# Patient Record
Sex: Female | Born: 1970 | Race: White | Hispanic: No | Marital: Married | Smoking: Never smoker
Health system: Southern US, Community
[De-identification: ages and names within clinical notes are randomized; demographics above are authoritative.]

## PROBLEM LIST (undated history)

## (undated) ENCOUNTER — Inpatient Hospital Stay (HOSPITAL_COMMUNITY): Payer: Self-pay

## (undated) DIAGNOSIS — F419 Anxiety disorder, unspecified: Secondary | ICD-10-CM

## (undated) DIAGNOSIS — Z923 Personal history of irradiation: Secondary | ICD-10-CM

## (undated) DIAGNOSIS — Z789 Other specified health status: Secondary | ICD-10-CM

## (undated) DIAGNOSIS — R030 Elevated blood-pressure reading, without diagnosis of hypertension: Secondary | ICD-10-CM

## (undated) DIAGNOSIS — K219 Gastro-esophageal reflux disease without esophagitis: Secondary | ICD-10-CM

## (undated) DIAGNOSIS — R112 Nausea with vomiting, unspecified: Secondary | ICD-10-CM

## (undated) DIAGNOSIS — C50919 Malignant neoplasm of unspecified site of unspecified female breast: Secondary | ICD-10-CM

## (undated) DIAGNOSIS — Z9889 Other specified postprocedural states: Secondary | ICD-10-CM

## (undated) HISTORY — DX: Personal history of irradiation: Z92.3

## (undated) HISTORY — DX: Gastro-esophageal reflux disease without esophagitis: K21.9

## (undated) HISTORY — DX: Other specified postprocedural states: Z98.890

## (undated) HISTORY — PX: BREAST LUMPECTOMY: SHX2

## (undated) HISTORY — PX: NO PAST SURGERIES: SHX2092

## (undated) HISTORY — DX: Elevated blood-pressure reading, without diagnosis of hypertension: R03.0

## (undated) HISTORY — PX: BREAST SURGERY: SHX581

## (undated) HISTORY — PX: EXCISION OF BREAST BIOPSY: SHX5822

## (undated) HISTORY — DX: Nausea with vomiting, unspecified: R11.2

---

## 2010-01-11 ENCOUNTER — Emergency Department (HOSPITAL_COMMUNITY): Admission: EM | Admit: 2010-01-11 | Discharge: 2010-01-11 | Payer: Self-pay | Admitting: Family Medicine

## 2013-11-29 ENCOUNTER — Emergency Department (HOSPITAL_COMMUNITY)
Admission: EM | Admit: 2013-11-29 | Discharge: 2013-11-29 | Disposition: A | Discharge status: Home / Self Care | Payer: Self-pay | Attending: Emergency Medicine | Admitting: Emergency Medicine

## 2013-11-29 ENCOUNTER — Encounter (HOSPITAL_COMMUNITY): Payer: Self-pay | Admitting: Emergency Medicine

## 2013-11-29 DIAGNOSIS — N949 Unspecified condition associated with female genital organs and menstrual cycle: Secondary | ICD-10-CM | POA: Insufficient documentation

## 2013-11-29 DIAGNOSIS — R110 Nausea: Secondary | ICD-10-CM | POA: Insufficient documentation

## 2013-11-29 DIAGNOSIS — R102 Pelvic and perineal pain: Secondary | ICD-10-CM

## 2013-11-29 DIAGNOSIS — N898 Other specified noninflammatory disorders of vagina: Secondary | ICD-10-CM | POA: Insufficient documentation

## 2013-11-29 DIAGNOSIS — Z91040 Latex allergy status: Secondary | ICD-10-CM | POA: Insufficient documentation

## 2013-11-29 LAB — CBC WITH DIFFERENTIAL/PLATELET
Basophils Absolute: 0.1 10*3/uL (ref 0.0–0.1)
Basophils Relative: 1 % (ref 0–1)
Eosinophils Absolute: 0.1 10*3/uL (ref 0.0–0.7)
Eosinophils Relative: 1 % (ref 0–5)
HCT: 41.1 % (ref 36.0–46.0)
Hemoglobin: 14.3 g/dL (ref 12.0–15.0)
Lymphocytes Relative: 14 % (ref 12–46)
Lymphs Abs: 1.5 10*3/uL (ref 0.7–4.0)
MCH: 33.5 pg (ref 26.0–34.0)
MCHC: 34.8 g/dL (ref 30.0–36.0)
MCV: 96.3 fL (ref 78.0–100.0)
Monocytes Absolute: 0.6 10*3/uL (ref 0.1–1.0)
Monocytes Relative: 6 % (ref 3–12)
Neutro Abs: 8.3 10*3/uL — ABNORMAL HIGH (ref 1.7–7.7)
Neutrophils Relative %: 79 % — ABNORMAL HIGH (ref 43–77)
Platelets: 235 10*3/uL (ref 150–400)
RBC: 4.27 MIL/uL (ref 3.87–5.11)
RDW: 12.3 % (ref 11.5–15.5)
WBC: 10.6 10*3/uL — ABNORMAL HIGH (ref 4.0–10.5)

## 2013-11-29 LAB — COMPREHENSIVE METABOLIC PANEL
ALT: 13 U/L (ref 0–35)
AST: 20 U/L (ref 0–37)
Albumin: 5 g/dL (ref 3.5–5.2)
Alkaline Phosphatase: 43 U/L (ref 39–117)
BUN: 16 mg/dL (ref 6–23)
CO2: 22 mEq/L (ref 19–32)
Calcium: 9.6 mg/dL (ref 8.4–10.5)
Chloride: 100 mEq/L (ref 96–112)
Creatinine, Ser: 0.78 mg/dL (ref 0.50–1.10)
GFR calc Af Amer: >90 mL/min (ref >90)
GFR calc non Af Amer: >90 mL/min (ref >90)
Glucose, Bld: 111 mg/dL — ABNORMAL HIGH (ref 70–99)
Potassium: 4.5 mEq/L (ref 3.5–5.1)
Sodium: 138 mEq/L (ref 135–145)
Total Bilirubin: 1.2 mg/dL (ref 0.3–1.2)
Total Protein: 8.1 g/dL (ref 6.0–8.3)

## 2013-11-29 LAB — URINALYSIS, ROUTINE W REFLEX MICROSCOPIC
Bilirubin Urine: NEGATIVE
Glucose, UA: NEGATIVE mg/dL
Hgb urine dipstick: NEGATIVE
Ketones, ur: >80 mg/dL — AB
Leukocytes, UA: NEGATIVE
Nitrite: NEGATIVE
Protein, ur: NEGATIVE mg/dL
Specific Gravity, Urine: 1.023 (ref 1.005–1.030)
Urobilinogen, UA: 0.2 mg/dL (ref 0.0–1.0)
pH: 5.5 (ref 5.0–8.0)

## 2013-11-29 LAB — WET PREP, GENITAL
Clue Cells Wet Prep HPF POC: NONE SEEN
Trich, Wet Prep: NONE SEEN
Yeast Wet Prep HPF POC: NONE SEEN

## 2013-11-29 MED ORDER — ONDANSETRON HCL 4 MG/2ML IJ SOLN
4.0000 mg | Freq: Once | INTRAMUSCULAR | Status: DC
  Filled 2013-11-29: qty 2

## 2013-11-29 MED ORDER — SODIUM CHLORIDE 0.9 % IV BOLUS (SEPSIS): 1000.0000 mL | Freq: Once | INTRAVENOUS | Status: DC

## 2013-11-29 MED ORDER — MORPHINE SULFATE 4 MG/ML IJ SOLN
4.0000 mg | Freq: Once | INTRAMUSCULAR | Status: DC
  Filled 2013-11-29: qty 1

## 2013-11-29 NOTE — ED Notes (Signed)
Went to start IV and medicate patient, pt refused IV and pain medication. Pt states "I just want the pelvic exam and to go home. My pain is manageable." Fayrene Helper, PA notified. Pelvic cart set up and at bedside.

## 2013-11-29 NOTE — ED Provider Notes (Signed)
CSN: [id number]     Arrival date & time 11/29/13  1751 History   First MD Initiated Contact with Patient 11/29/13 2017     Chief Complaint  Patient presents with  . Abdominal Pain   (Consider location/radiation/quality/duration/timing/severity/associated sxs/prior Treatment) HPI  42 year old female with no significant past medical history presents complaining of abdominal pain. Patient reports 2 weeks ago which has a mild left lower quadrant abdominal tenderness lasting for less than an hour and resolved without any specific treatment. Today while sitting she developed an acute onset of achy pain to the left low pelvic with occasional sharp shooting pain radiates to the back. Pain initially was sudden onset, 9/10, and now improved to 3/10 with taking 600 mg of ibuprofen earlier. She reported mild nausea only. No complaints of fever, chills, productive cough, chest pain, shortness of breath, loss of appetite, dysuria, hematuria, hematochezia, melena, vaginal discharge, or rash. She denies any recent trauma. Patient believes she is about to start her menstrual period.   History reviewed. No pertinent past medical history. History reviewed. No pertinent past surgical history. History reviewed. No pertinent family history. History  Substance Use Topics  . Smoking status: Never Smoker   . Smokeless tobacco: Not on file  . Alcohol Use: Yes   OB History   Grav Para Term Preterm Abortions TAB SAB Ect Mult Living                 Review of Systems  All other systems reviewed and are negative.    Allergies  Latex  Home Medications   Current Outpatient Rx  Name  Route  Sig  Dispense  Refill  . ibuprofen (ADVIL,MOTRIN) 200 MG tablet   Oral   Take 400-600 mg by mouth every 4 (four) hours as needed (pain).          BP 129/70  Pulse 71  Temp(Src) 98.2 F (36.8 C) (Oral)  Resp 16  SpO2 99% Physical Exam  Nursing note and vitals reviewed. Constitutional: She appears well-developed  and well-nourished. No distress.  HENT:  Head: Normocephalic and atraumatic.  Eyes: Conjunctivae are normal.  Neck: Normal range of motion. Neck supple.  Cardiovascular: Normal rate and regular rhythm.   Pulmonary/Chest: Effort normal and breath sounds normal. She exhibits no tenderness.  Abdominal: Soft. There is no tenderness.  Genitourinary: Uterus normal. There is no rash or lesion on the right labia. There is no rash or lesion on the left labia. Cervix exhibits no motion tenderness and no discharge. Right adnexum displays no mass and no tenderness. Left adnexum displays no mass and no tenderness. No erythema, tenderness or bleeding around the vagina. Vaginal discharge found.  Chaperone present  No cva tenderness.  Lymphadenopathy:       Right: No inguinal adenopathy present.       Left: No inguinal adenopathy present.    ED Course  Procedures (including critical care time)  10:33 PM Pt with pain to LLQ.  No significant discomfort with pelvic exam but pt does have copious vaginal discharge.  I offer pain medication, pt declined.  At this time, i do not think pt have finding suggestive of ovarian torsion, no significant pelvic tenderness to suggest TOA.  Low suspicion for appy.  No bowel discomfort suggestive of colitis.  11:06 PM Wet prep with WBC TNTC.  I did offer pt for prophylactic treatment for GC/Ch, she prefers to wait for culture result instead  Pt currently denies any significant pain, does not want any  pain medication.  i recommend NSAIDs at home as needed.  Return precaution discussed.    Labs Review Labs Reviewed  WET PREP, GENITAL - Abnormal; Notable for the following:    WBC, Wet Prep HPF POC TOO NUMEROUS TO COUNT (*)    All other components within normal limits  CBC WITH DIFFERENTIAL - Abnormal; Notable for the following:    WBC 10.6 (*)    Neutrophils Relative % 79 (*)    Neutro Abs 8.3 (*)    All other components within normal limits  COMPREHENSIVE METABOLIC  PANEL - Abnormal; Notable for the following:    Glucose, Bld 111 (*)    All other components within normal limits  URINALYSIS, ROUTINE W REFLEX MICROSCOPIC - Abnormal; Notable for the following:    Ketones, ur >80 (*)    All other components within normal limits  GC/CHLAMYDIA PROBE AMP   Imaging Review No results found.  EKG Interpretation   None       MDM   1. Pelvic pain in female    BP 129/70  Pulse 71  Temp(Src) 98.2 F (36.8 C) (Oral)  Resp 16  SpO2 99%      Fayrene Helper, PA-C 11/29/13 2310

## 2013-11-29 NOTE — ED Notes (Addendum)
Pt c/o severe pelvic pain onset this afternoon, radiating into her back. Denies any vag discharge or bleeding, she is getting ready to start her cycle. Reports pelvic pain with urination and bowel movements for past few days also. Took ibuprofen with no relief

## 2013-11-29 NOTE — ED Notes (Signed)
Pt c/o lower abd pain, worse on left.  Pt denies any nausea or vomiting.

## 2013-11-29 NOTE — ED Notes (Addendum)
Cindy [name] tran at bedside performing pelvic exam. This RN as chaperone.

## 2013-11-30 LAB — GC/CHLAMYDIA PROBE AMP: CT Probe RNA: NEGATIVE

## 2013-12-03 NOTE — ED Provider Notes (Signed)
Medical screening examination/treatment/procedure(s) were performed by non-physician practitioner and as supervising physician I was immediately available for consultation/collaboration.  EKG Interpretation   None        Girolamo Lortie, MD 12/03/13 0909 

## 2013-12-26 ENCOUNTER — Inpatient Hospital Stay (HOSPITAL_COMMUNITY): Payer: Self-pay

## 2013-12-26 ENCOUNTER — Inpatient Hospital Stay (HOSPITAL_COMMUNITY)
Admission: AD | Admit: 2013-12-26 | Discharge: 2013-12-26 | Disposition: A | Discharge status: Home / Self Care | Payer: Self-pay | Source: Ambulatory Visit | Attending: Obstetrics & Gynecology | Admitting: Obstetrics & Gynecology

## 2013-12-26 ENCOUNTER — Encounter (HOSPITAL_COMMUNITY): Payer: Self-pay | Admitting: *Deleted

## 2013-12-26 DIAGNOSIS — O4690 Antepartum hemorrhage, unspecified, unspecified trimester: Secondary | ICD-10-CM

## 2013-12-26 DIAGNOSIS — R109 Unspecified abdominal pain: Secondary | ICD-10-CM | POA: Insufficient documentation

## 2013-12-26 DIAGNOSIS — O209 Hemorrhage in early pregnancy, unspecified: Secondary | ICD-10-CM | POA: Insufficient documentation

## 2013-12-26 HISTORY — DX: Other specified health status: Z78.9

## 2013-12-26 LAB — CBC
Platelets: 217 10*3/uL (ref 150–400)
RBC: 3.8 MIL/uL — ABNORMAL LOW (ref 3.87–5.11)
RDW: 12.5 % (ref 11.5–15.5)
WBC: 9.3 10*3/uL (ref 4.0–10.5)

## 2013-12-26 LAB — URINALYSIS, ROUTINE W REFLEX MICROSCOPIC
Bilirubin Urine: NEGATIVE
Ketones, ur: NEGATIVE mg/dL
Specific Gravity, Urine: 1.01 (ref 1.005–1.030)
Urobilinogen, UA: 0.2 mg/dL (ref 0.0–1.0)
pH: 6 (ref 5.0–8.0)

## 2013-12-26 LAB — URINE MICROSCOPIC-ADD ON

## 2013-12-26 LAB — ABO/RH: ABO/RH(D): O POS

## 2013-12-26 NOTE — MAU Provider Note (Signed)
History     CSN: [id number]  Arrival date and time: 12/26/13 1529   None     Chief Complaint  Patient presents with  . Possible Pregnancy  . Vaginal Bleeding  . Abdominal Cramping   HPI pt is G2P1001 @[redacted]w[redacted]d  pt c/o brown discharge yesterday morning and then pink last and then heavy med colored bleeding Today- has been through 1 pad today with clot, like a period.  Pt has not had n/v/constipation/diarrhea.  Pt denies cough or fever.  Pt denies unusual vaginal discharge.   RN note: Garvin Fila, RN Registered Nurse Signed  MAU Note Service date: 12/26/2013 3:38 PM   Patient states she has had two positive home pregnancy tests. States she started spotting yesterday but this am had red bleeding with clots like a period and started having abdominal cramping.      No past medical history on file.  No past surgical history on file.  No family history on file.  History  Substance Use Topics  . Smoking status: Never Smoker   . Smokeless tobacco: Not on file  . Alcohol Use: Yes    Allergies:  Allergies  Allergen Reactions  . Latex Swelling and Rash    Prescriptions prior to admission  Medication Sig Dispense Refill  . ibuprofen (ADVIL,MOTRIN) 200 MG tablet Take 400-600 mg by mouth every 4 (four) hours as needed (pain).        Review of Systems  Constitutional: Negative for fever and chills.  Gastrointestinal: Negative for nausea, vomiting, abdominal pain, diarrhea and constipation.  Genitourinary: Negative for dysuria and urgency.   Physical Exam   There were no vitals taken for this visit.  Physical Exam  Nursing note and vitals reviewed. Constitutional: She is oriented to person, place, and time. She appears well-developed and well-nourished.  Anxious appearing  HENT:  Head: Normocephalic.  Eyes: Pupils are equal, round, and reactive to light.  Neck: Normal range of motion. Neck supple.  Cardiovascular: Normal rate.   Respiratory: Effort normal.  GI:  Soft. She exhibits no distension. There is no tenderness. There is no rebound and no guarding.  Genitourinary:  sm- mod dark red blood in vault; with small clot; cervix closed; uterus NSSC NT; adnexa without palpable enlargement or tenderness  Musculoskeletal: Normal range of motion.  Neurological: She is alert and oriented to person, place, and time.  Skin: Skin is warm and dry.  Psychiatric: She has a normal mood and affect.    MAU Course  Procedures Results for orders placed during the hospital encounter of 12/26/13 (from the past 24 hour(s))  URINALYSIS, ROUTINE W REFLEX MICROSCOPIC     Status: Abnormal   Collection Time    12/26/13  3:45 PM      Result Value Range   Color, Urine YELLOW  YELLOW   APPearance CLEAR  CLEAR   Specific Gravity, Urine 1.010  1.005 - 1.030   pH 6.0  5.0 - 8.0   Glucose, UA NEGATIVE  NEGATIVE mg/dL   Hgb urine dipstick LARGE (*) NEGATIVE   Bilirubin Urine NEGATIVE  NEGATIVE   Ketones, ur NEGATIVE  NEGATIVE mg/dL   Protein, ur NEGATIVE  NEGATIVE mg/dL   Urobilinogen, UA 0.2  0.0 - 1.0 mg/dL   Nitrite NEGATIVE  NEGATIVE   Leukocytes, UA NEGATIVE  NEGATIVE  URINE MICROSCOPIC-ADD ON     Status: None   Collection Time    12/26/13  3:45 PM      Result Value Range  Squamous Epithelial / LPF RARE  RARE   WBC, UA 0-2  <3 WBC/hpf   RBC / HPF 0-2  <3 RBC/hpf   Bacteria, UA RARE  RARE  POCT PREGNANCY, URINE     Status: Abnormal   Collection Time    12/26/13  3:59 PM      Result Value Range   Preg Test, Ur POSITIVE (*) NEGATIVE  CBC     Status: Abnormal   Collection Time    12/26/13  4:33 PM      Result Value Range   WBC 9.3  4.0 - 10.5 K/uL   RBC 3.80 (*) 3.87 - 5.11 MIL/uL   Hemoglobin 12.3  12.0 - 15.0 g/dL   HCT 40.9  81.1 - 91.4 %   MCV 94.7  78.0 - 100.0 fL   MCH 32.4  26.0 - 34.0 pg   MCHC 34.2  30.0 - 36.0 g/dL   RDW 78.2  95.6 - 21.3 %   Platelets 217  150 - 400 K/uL  HCG, QUANTITATIVE, PREGNANCY     Status: Abnormal   Collection Time     12/26/13  4:33 PM      Result Value Range   hCG, Beta Chain, Quant, S 977 (*) <5 mIU/mL  pt had neg GC/chlamdyia and wet prep on 11/29/2013 at [facility] ED Assessment and Plan  Bleeding in pregnancy-?IUGS no Y/S seen F/u Sat Jan 3 for repeat HCG, sooner if increase in pain or bleeding  Ramal Eckhardt 12/26/2013, 3:43 PM

## 2013-12-26 NOTE — MAU Note (Signed)
Patient states she has had two positive home pregnancy tests. States she started spotting yesterday but this am had red bleeding with clots like a period and started having abdominal cramping.

## 2013-12-28 ENCOUNTER — Encounter (HOSPITAL_COMMUNITY): Payer: Self-pay | Admitting: *Deleted

## 2013-12-28 ENCOUNTER — Inpatient Hospital Stay (HOSPITAL_COMMUNITY)
Admission: AD | Admit: 2013-12-28 | Discharge: 2013-12-28 | Disposition: A | Discharge status: Home / Self Care | Payer: BC Managed Care – PPO | Source: Ambulatory Visit | Attending: Obstetrics and Gynecology | Admitting: Obstetrics and Gynecology

## 2013-12-28 DIAGNOSIS — O039 Complete or unspecified spontaneous abortion without complication: Secondary | ICD-10-CM | POA: Insufficient documentation

## 2013-12-28 DIAGNOSIS — R109 Unspecified abdominal pain: Secondary | ICD-10-CM | POA: Insufficient documentation

## 2013-12-28 LAB — CBC
HEMATOCRIT: 38.6 % (ref 36.0–46.0)
HEMOGLOBIN: 13 g/dL (ref 12.0–15.0)
MCH: 32.3 pg (ref 26.0–34.0)
MCHC: 33.7 g/dL (ref 30.0–36.0)
MCV: 95.8 fL (ref 78.0–100.0)
Platelets: 212 10*3/uL (ref 150–400)
RBC: 4.03 MIL/uL (ref 3.87–5.11)
RDW: 12.2 % (ref 11.5–15.5)
WBC: 8.2 10*3/uL (ref 4.0–10.5)

## 2013-12-28 LAB — HCG, QUANTITATIVE, PREGNANCY: hCG, Beta Chain, Quant, S: 447 m[IU]/mL — ABNORMAL HIGH (ref <5)

## 2013-12-28 NOTE — MAU Note (Signed)
Was here recently - confirmed preg, levels were low, was to comeback for follow up. Bleeding has increased.  Pelvic pressure increased since last night, passing a lot of clots.

## 2013-12-28 NOTE — MAU Provider Note (Signed)
Attestation of Attending Supervision of Advanced Practitioner (CNM/NP): Evaluation and management procedures were performed by the Advanced Practitioner under my supervision and collaboration.  I have reviewed the Advanced Practitioner's note and chart, and I agree with the management and plan.  Reggie Bise 12/28/2013 10:39 PM

## 2013-12-28 NOTE — MAU Provider Note (Signed)
History     CSN: [id number]  Arrival date and time: 12/28/13 1344   First Provider Initiated Contact with Patient 12/28/13 1600      Chief Complaint  Patient presents with  . Threatened Miscarriage   HPI  Cindy [name] is 43 y.o. G2P1001 [redacted]w[redacted]d weeks presenting follow up BHCG.  Was seen in MAU 12/31 with vaginal bleeding and cramping. BHCG on that visit was 977 and U/S showed ? UIGS no YS.  SInce that visit, she report heavier bleeding this am with huge clots with a "different texture, from liver looking to chunky".  Had cramping yesterday and today but since heavy bleeding this am, cramping less.  States she went to bathroom here and the bleeding is light and only 1 small clot.     Past Medical History  Diagnosis Date  . Medical history non-contributory     Past Surgical History  Procedure Laterality Date  . No past surgeries      History reviewed. No pertinent family history.  History  Substance Use Topics  . Smoking status: Never Smoker   . Smokeless tobacco: Not on file  . Alcohol Use: 1.2 oz/week    2 Cans of beer per week     Comment: few times a week    Allergies:  Allergies  Allergen Reactions  . Latex Swelling and Rash    Prescriptions prior to admission  Medication Sig Dispense Refill  . acetaminophen (TYLENOL) 500 MG tablet Take 500 mg by mouth every 6 (six) hours as needed.      . Prenatal Vit-Fe Fumarate-FA (PRENATAL MULTIVITAMIN) TABS tablet Take 1 tablet by mouth daily at 12 noon.        Review of Systems  Gastrointestinal: Positive for abdominal pain (cramping).  Genitourinary:       Small amount of bleeding   Physical Exam   Blood pressure 141/78, pulse 99, temperature 99.3 F (37.4 C), temperature source Oral, resp. rate 20, height 5' 2.5" (1.588 m), weight 139 lb (63.05 kg), last menstrual period 11/04/2013.  Physical Exam  Constitutional: She is oriented to person, place, and time. She appears well-developed and well-nourished.   Genitourinary:  No indicated  Neurological: She is alert and oriented to person, place, and time.  Skin: Skin is warm and dry.  Psychiatric: She has a normal mood and affect. Her behavior is normal.   Results for orders placed during the hospital encounter of 12/28/13 (from the past 24 hour(s))  CBC     Status: None   Collection Time    12/28/13  2:17 PM      Result Value Range   WBC 8.2  4.0 - 10.5 K/uL   RBC 4.03  3.87 - 5.11 MIL/uL   Hemoglobin 13.0  12.0 - 15.0 g/dL   HCT 38.6  36.0 - 46.0 %   MCV 95.8  78.0 - 100.0 fL   MCH 32.3  26.0 - 34.0 pg   MCHC 33.7  30.0 - 36.0 g/dL   RDW 12.2  11.5 - 15.5 %   Platelets 212  150 - 400 K/uL  HCG, QUANTITATIVE, PREGNANCY     Status: Abnormal   Collection Time    12/28/13  2:17 PM      Result Value Range   hCG, Beta Chain, Quant, S 447 (*) <5 mIU/mL   MAU Course  Procedures  MDM Discussed resulted BHCG and miscarriage.  Patient is sad.  She understands follow up plans.  Husband is with her  Assessment and Plan  A:  Miscarriage in the first trimester  P:  Follow up in the [facility] in 2 weeks for repeat BHCG      Instructed to return for worsening bleeding or pain     Pelvic rest until seen in clinic    Continue prenatal vitamins KEY,EVE M 12/28/2013, 4:02 PM

## 2013-12-28 NOTE — Discharge Instructions (Signed)
Miscarriage  A miscarriage is the loss of an unborn baby (fetus) before the 20th week of pregnancy. The cause is often unknown.   HOME CARE  · You may need to stay in bed (bed rest), or you may be able to do light activity. Go about activity as told by your doctor.  · Have help at home.  · Write down how many pads you use each day. Write down how soaked they are.  · Do not use tampons. Do not wash out your vagina (douche) or have sex (intercourse) until your doctor approves.  · Only take medicine as told by your doctor.  · Do not take aspirin.  · Keep all doctor visits as told.  · If you or your partner have problems with grieving, talk to your doctor. You can also try counseling. Give yourself time to grieve before trying to get pregnant again.  GET HELP RIGHT AWAY IF:  · You have bad cramps or pain in your back or belly (abdomen).  · You have a fever.  · You pass large clumps of blood (clots) from your vagina that are walnut-sized or larger. Save the clumps for your doctor to see.  · You pass large amounts of tissue from your vagina. Save the tissue for your doctor to see.  · You have more bleeding.  · You have thick, bad-smelling fluid (discharge) coming from the vagina.  · You get lightheaded, weak, or you pass out (faint).  · You have chills.  MAKE SURE YOU:  · Understand these instructions.  · Will watch your condition.  · Will get help right away if you are not doing well or get worse.  Document Released: 03/06/2012 Document Reviewed: 03/06/2012  [employer]® Patient Information ©2014 [employer].

## 2013-12-30 NOTE — MAU Provider Note (Signed)
Attestation of Attending Supervision of Advanced Practitioner (CNM/NP): Evaluation and management procedures were performed by the Advanced Practitioner under my supervision and collaboration. I have reviewed the Advanced Practitioner's note and chart, and I agree with the management and plan.  Jerren Flinchbaugh H. 12:54 PM

## 2014-01-02 ENCOUNTER — Ambulatory Visit: Payer: Self-pay

## 2014-01-11 ENCOUNTER — Other Ambulatory Visit: Payer: BC Managed Care – PPO

## 2014-01-11 DIAGNOSIS — O039 Complete or unspecified spontaneous abortion without complication: Secondary | ICD-10-CM

## 2014-01-11 LAB — HCG, QUANTITATIVE, PREGNANCY: hCG, Beta Chain, Quant, S: 2.1 m[IU]/mL

## 2014-10-28 ENCOUNTER — Encounter (HOSPITAL_COMMUNITY): Payer: Self-pay | Admitting: *Deleted

## 2014-10-31 ENCOUNTER — Encounter (HOSPITAL_COMMUNITY): Payer: Self-pay | Admitting: *Deleted

## 2014-11-27 ENCOUNTER — Other Ambulatory Visit (HOSPITAL_COMMUNITY): Payer: Self-pay | Admitting: Gynecology

## 2015-01-13 ENCOUNTER — Ambulatory Visit: Payer: BC Managed Care – PPO | Admitting: Obstetrics & Gynecology

## 2015-04-15 ENCOUNTER — Other Ambulatory Visit: Payer: Self-pay | Admitting: Gynecology

## 2015-04-15 DIAGNOSIS — N644 Mastodynia: Secondary | ICD-10-CM

## 2015-04-21 ENCOUNTER — Ambulatory Visit
Admission: RE | Admit: 2015-04-21 | Discharge: 2015-04-21 | Disposition: A | Discharge status: Home / Self Care | Payer: BLUE CROSS/BLUE SHIELD | Source: Ambulatory Visit | Attending: Gynecology | Admitting: Gynecology

## 2015-04-21 DIAGNOSIS — N644 Mastodynia: Secondary | ICD-10-CM

## 2017-04-27 ENCOUNTER — Encounter: Payer: Self-pay | Admitting: Adult Health

## 2017-04-27 LAB — HM MAMMOGRAPHY

## 2017-04-27 LAB — HM PAP SMEAR: HM PAP: NEGATIVE

## 2017-10-18 ENCOUNTER — Emergency Department (HOSPITAL_COMMUNITY): Payer: Managed Care, Other (non HMO)

## 2017-10-18 ENCOUNTER — Emergency Department (HOSPITAL_COMMUNITY)
Admission: EM | Admit: 2017-10-18 | Discharge: 2017-10-18 | Disposition: A | Discharge status: Home / Self Care | Payer: Managed Care, Other (non HMO) | Attending: Emergency Medicine | Admitting: Emergency Medicine

## 2017-10-18 ENCOUNTER — Encounter (HOSPITAL_COMMUNITY): Payer: Self-pay

## 2017-10-18 DIAGNOSIS — Z91040 Latex allergy status: Secondary | ICD-10-CM | POA: Insufficient documentation

## 2017-10-18 DIAGNOSIS — Y9389 Activity, other specified: Secondary | ICD-10-CM | POA: Insufficient documentation

## 2017-10-18 DIAGNOSIS — S99921A Unspecified injury of right foot, initial encounter: Secondary | ICD-10-CM | POA: Diagnosis present

## 2017-10-18 DIAGNOSIS — Z23 Encounter for immunization: Secondary | ICD-10-CM | POA: Diagnosis not present

## 2017-10-18 DIAGNOSIS — Y999 Unspecified external cause status: Secondary | ICD-10-CM | POA: Insufficient documentation

## 2017-10-18 DIAGNOSIS — Z791 Long term (current) use of non-steroidal anti-inflammatories (NSAID): Secondary | ICD-10-CM | POA: Diagnosis not present

## 2017-10-18 DIAGNOSIS — S93601A Unspecified sprain of right foot, initial encounter: Secondary | ICD-10-CM | POA: Insufficient documentation

## 2017-10-18 DIAGNOSIS — W01198A Fall on same level from slipping, tripping and stumbling with subsequent striking against other object, initial encounter: Secondary | ICD-10-CM | POA: Diagnosis not present

## 2017-10-18 DIAGNOSIS — Y929 Unspecified place or not applicable: Secondary | ICD-10-CM | POA: Insufficient documentation

## 2017-10-18 DIAGNOSIS — Z79899 Other long term (current) drug therapy: Secondary | ICD-10-CM | POA: Insufficient documentation

## 2017-10-18 MED ORDER — TETANUS-DIPHTH-ACELL PERTUSSIS 5-2.5-18.5 LF-MCG/0.5 IM SUSP
0.5000 mL | Freq: Once | INTRAMUSCULAR | Status: AC
  Administered 2017-10-18: 0.5 mL via INTRAMUSCULAR
  Filled 2017-10-18: qty 0.5

## 2017-10-18 NOTE — ED Provider Notes (Signed)
[facility] EMERGENCY DEPARTMENT Provider Note   CSN: [id number] Arrival date & time: 10/18/17  5631     History   Chief Complaint No chief complaint on file.   HPI Cindy [name] is a 46 y.o. female.  HPI   Patient is a 46 year old female no significant past medical history presenting for 10 days of right foot pain after kicking a door.  No other injuries occurred at this event.  Patient reports that she has had aching in the right foot approximately 4 out of 10 in severity.  Patient was ambulatory immediately after the event.  Patient is attempted ibuprofen, icing, and elevation of foot.  Patient denies any weakness, numbness in the toes.  Patient reports she has had some swelling and ecchymosis, but no erythema.  No history of diabetes.  Past Medical History:  Diagnosis Date  . Medical history non-contributory     There are no active problems to display for this patient.   Past Surgical History:  Procedure Laterality Date  . NO PAST SURGERIES      OB History    Gravida Para Term Preterm AB Living   2 1 1     1    SAB TAB Ectopic Multiple Live Births                   Home Medications    Prior to Admission medications   Medication Sig Start Date End Date Taking? Authorizing Provider  ibuprofen (ADVIL,MOTRIN) 200 MG tablet Take 200 mg by mouth every 6 (six) hours as needed for mild pain.   Yes [provider]  acetaminophen (TYLENOL) 500 MG tablet Take 500 mg by mouth every 6 (six) hours as needed.    [provider]  Prenatal Vit-Fe Fumarate-FA (PRENATAL MULTIVITAMIN) TABS tablet Take 1 tablet by mouth daily at 12 noon.    [provider]    Family History No family history on file.  Social History Social History  Substance Use Topics  . Smoking status: Never Smoker  . Smokeless tobacco: Never Used  . Alcohol use 1.2 oz/week    2 Cans of beer per week     Comment: few times a week     Allergies    Latex   Review of Systems Review of Systems  Musculoskeletal: Positive for joint swelling.  Skin: Positive for color change and wound.  Neurological: Negative for weakness and numbness.     Physical Exam Updated Vital Signs BP (!) 148/91   Pulse 78   Temp 98.3 F (36.8 C) (Oral)   Resp 16   LMP 09/12/2017   SpO2 99%   Physical Exam  Constitutional: She appears well-developed and well-nourished. No distress.  Sitting comfortably in bed.  HENT:  Head: Normocephalic and atraumatic.  Eyes: Conjunctivae are normal. Right eye exhibits no discharge. Left eye exhibits no discharge.  EOMs normal to gross examination.  Neck: Normal range of motion.  Cardiovascular: Normal rate and regular rhythm.   Intact, equal, and 2+ DP and PT pulses bilaterally.  Pulmonary/Chest:  Normal respiratory effort. Patient converses comfortably. No audible wheeze or stridor.  Abdominal: She exhibits no distension.  Musculoskeletal: Normal range of motion.  Ecchymosis noted in inter-webspace between first and second digits of right lower extremity.  No erythema or edema. Full range of motion of right ankle including flexion, extension, inversion, and eversion.  No pain to palpation of calcaneus, MTPs, navicular, medial malleolus, lateral malleolus, or base of fifth metatarsal.  Neurological: She is alert.  Cranial nerves intact to gross observation. Patient moves extremities without difficulty.  Skin: Skin is warm and dry. She is not diaphoretic.  Psychiatric: She has a normal mood and affect. Her behavior is normal. Judgment and thought content normal.  Nursing note and vitals reviewed.    ED Treatments / Results  Labs (all labs ordered are listed, but only abnormal results are displayed) Labs Reviewed - No data to display  EKG  EKG Interpretation None       Radiology Dg Foot Complete Right  Result Date: 10/18/2017 CLINICAL DATA:  Right foot pain and bruising after blunt trauma 10  days ago. EXAM: RIGHT FOOT COMPLETE - 3+ VIEW COMPARISON:  None. FINDINGS: There is no evidence of fracture or dislocation. There is no evidence of arthropathy or other focal bone abnormality. Soft tissues are unremarkable. IMPRESSION: Negative. Electronically Signed   By: Lorriane Shire M.D.   On: 10/18/2017 10:00    Procedures Procedures (including critical care time)  Medications Ordered in ED Medications  Tdap (BOOSTRIX) injection 0.5 mL (0.5 mLs Intramuscular Given 10/18/17 1329)     Initial Impression / Assessment and Plan / ED Course  I have reviewed the triage vital signs and the nursing notes.  Pertinent labs & imaging results that were available during my care of the patient were reviewed by me and considered in my medical decision making (see chart for details).  Clinical Course as of Oct 18 1330  Tue Oct 18, 2017  1246 BP: Marland Kitchen 148/91 [AM]    Clinical Course User Index [AM] Albesa Seen, PA-C     Final Clinical Impressions(s) / ED Diagnoses   Final diagnoses:  Sprain of right foot, initial encounter   Examination of the foot is not concerning for Lisfranc injury, or other soft tissue concerns.  X-ray negative for fracture.  Pain is mostly isolated to the great toe.  There is ecchymosis, but minimal swelling at this time.  This is likely a sprain of the foot.  Patient is stable to follow-up with orthopedics as needed, and use a postop shoe as needed for protection and support. RICE therapy recommended to patient.  Tdap updated today due to small cut in foot. Patient and family are in agreement and understanding of the plan of care.  Elevated blood pressure noted to patient.  Patient reports that she drank coffee before coming in and often has elevations.  Patient will follow up with primary care provider about this.  New Prescriptions New Prescriptions   No medications on file     Tamala Julian 10/18/17 [place], MD 10/18/17 1712

## 2017-10-18 NOTE — ED Triage Notes (Signed)
Patient complains of right foot pain x 10 days. States pain worse with ambulation after kicking door

## 2017-10-18 NOTE — Discharge Instructions (Addendum)
Please see the information and instructions below regarding your visit.  Your diagnoses today include:  1. Sprain of right foot, initial encounter    Your provider has diagnosed you as suffering from an foot sprain. Foot sprain occurs when the ligaments that hold the foot together may be stretched or torn. It may take 4 to 6 weeks to heal.  Tests performed today include: An x-ray of your foot - does NOT show any broken bones  See side panel of your discharge paperwork for testing performed today. Vital signs are listed at the bottom of these instructions.   Medications prescribed:  Take any prescribed medications only as prescribed, and any over the counter medications only as directed on the packaging.  You are prescribed ibuprofen a non-steroidal anti-inflammatory agent (NSAID) for pain. You may take 400-600 mg every 6 hours as needed for pain. If still requiring this medication around the clock for acute pain after 10 days, please see your primary healthcare provider.  You may combine this medication with Tylenol, 650 mg every 6 hours, so you are receiving something for pain every 3 hours.  This is not a long-term medication unless under the care and direction of your primary provider. Taking this medication long-term and not under the supervision of a healthcare provider could increase the risk of stomach ulcers, kidney problems, and cardiovascular problems such as high blood pressure.    Home care instructions:  Follow R.I.C.E. Protocol: R - rest your injury  I  - use ice on injury without applying directly to skin C - compress injury with bandage or splint E - elevate the injury as much as possible  For Activity: Wear postop shoe as needed for 1-2 weeks for protection and stabilization. If prescribed crutches, use crutches with non-weight bearing for the first few days. Then, you may walk on your ankle as the pain allows, or as instructed. Start gradually with weight bearing on the  affected ankle. Once you can walk pain free, then try jogging. When you can run forwards, then you can try moving side-to-side. If you cannot walk without crutches in one week, you need a re-check.  Please follow any educational materials contained in this packet.   Follow-up instructions: Please follow-up with your primary care provider or the provided orthopedic (bone specialist) listed in this packet if you continue to have significant pain or trouble walking in 1 week. In this case you may have a severe sprain that requires further care.   Return instructions:  Please return if your toes are numb or tingling, appear gray or blue, are much colder than your other foot, or you have severe pain (also elevate leg and loosen splint or wrap). Please return to the Emergency Department if you experience worsening symptoms.  Please return if you have any other emergent concerns.  Additional Information:   Your vital signs today were: BP (!) 148/91    Pulse 78    Temp 98.3 F (36.8 C) (Oral)    Resp 16    LMP 09/12/2017    SpO2 99%  If your blood pressure (BP) was elevated on multiple readings during this visit above 130 for the top number or above 80 for the bottom number, please have this repeated by your primary care provider within one month. --------------  Thank you for allowing [place] to participate in your care today.     To find a primary care or specialty doctor please call [phone] or [phone] to access "[facility]  Find a Doctor Service."  You may also go on the [employer] at [email or web address]  There are also multiple [facility] practices throughout the [place] that are frequently accepting new patients. You may find a clinic that is close to your home and contact them.  [facility] [place] [phone]  [facility] in [place] (also locations in [place] and [place]) - [place] [phone]  [place] [phone]

## 2018-02-22 ENCOUNTER — Encounter (INDEPENDENT_AMBULATORY_CARE_PROVIDER_SITE_OTHER): Payer: Self-pay | Admitting: Orthopaedic Surgery

## 2018-02-22 ENCOUNTER — Ambulatory Visit (INDEPENDENT_AMBULATORY_CARE_PROVIDER_SITE_OTHER): Payer: Managed Care, Other (non HMO) | Admitting: Orthopaedic Surgery

## 2018-02-22 ENCOUNTER — Ambulatory Visit (INDEPENDENT_AMBULATORY_CARE_PROVIDER_SITE_OTHER): Payer: Managed Care, Other (non HMO)

## 2018-02-22 DIAGNOSIS — M25571 Pain in right ankle and joints of right foot: Secondary | ICD-10-CM | POA: Diagnosis not present

## 2018-02-22 DIAGNOSIS — R2241 Localized swelling, mass and lump, right lower limb: Secondary | ICD-10-CM

## 2018-02-22 NOTE — Progress Notes (Signed)
Office Visit Note   Patient: Cindy [name]           Date of Birth: October 16, 1971           MRN: [id number] Visit Date: 02/22/2018              Requested by: No referring provider defined for this encounter. PCP: Patient, No Pcp Per   Assessment & Plan: Visit Diagnoses:  1. Pain in right ankle and joints of right foot   2. Foot mass, right     Plan: Given the mass on the lateral foot as well as the fact that her pain is persisting now for 4 months an MRI of the right foot is warranted to assess the mass as well as assess the MTP joint for stress fracture and to look at the cartilage given the severity of her continued pain.  She is trialed all forms of conservative treatment and failed rest, time, ice, activity modification, postoperative shoe and anti-inflammatories.  I am concerned about a mass on the plantar aspect of her foot as well.  We will see her back once the MRI is obtained.  Follow-Up Instructions: Return in about 2 weeks (around 03/08/2018).   Orders:  Orders Placed This Encounter  Procedures  . XR Foot Complete Right   No orders of the defined types were placed in this encounter.     Procedures: No procedures performed   Clinical Data: No additional findings.   Subjective: Chief Complaint  Patient presents with  . Right Ankle - Pain  Patient is a very pleasant 47 year old who denies any history of significant foot pain until she injured her right foot accidentally kicking a door in October 2018.  Is now been 4 months and she has a constant aching pain she points all around the first ray in the first MTP joint of her right foot.  She has had x-rays in October which were negative for any fracture.  This was all done to the emergency room.  She denies any specific problems with her feet before then.  She was given a postoperative shoe and try to transition back to regular shoes but is grabbing her foot too much and causing still pain.  She denies being a diabetic.   She denies a history of gout.  She says she will are still uncomfortable and is becoming constant ache.  She is tried activity modification and anti-inflammatories.  She also has noted a mass that is come up in the soft tissue on the plantar aspect of her foot and this is near the MTP joint where she points to.  She also had a laceration along the medial border.  HPI  Review of Systems She currently denies any headache, chest pain, shortness of breath, fever, chills, nausea, vomiting.  Objective: Vital Signs: There were no vitals taken for this visit.  Physical Exam She is alert and oriented x3 and in no acute distress Ortho Exam Examination of her right foot shows a palpable mass along the first ray on the plantar aspect of her foot right along the plantar fascia, suggestive of a plantar fibromatosis but is definitely a mass in the soft tissue.  She has significant pain at the first MTP joint and stressing the joint with no open wounds.  Hyperflexion causes severe pain.  There is slight bunion malalignment and this is transferring pain to the second ray. Specialty Comments:  No specialty comments available.  Imaging: Xr Foot Complete Right  Result  Date: 02/22/2018 3 views of the right foot show slight malalignment of the first MTP joint suggesting a mild bunion deformity.  Otherwise there is no acute findings on plain film.    PMFS History: Patient Active Problem List   Diagnosis Date Noted  . Foot mass, right 02/22/2018  . Pain in right ankle and joints of right foot 02/22/2018   Past Medical History:  Diagnosis Date  . Medical history non-contributory     No family history on file.  Past Surgical History:  Procedure Laterality Date  . NO PAST SURGERIES     Social History   Occupational History  . Not on file  Tobacco Use  . Smoking status: Never Smoker  . Smokeless tobacco: Never Used  Substance and Sexual Activity  . Alcohol use: Yes    Alcohol/week: 1.2 oz     Types: 2 Cans of beer per week    Comment: few times a week  . Drug use: No  . Sexual activity: Yes

## 2018-02-23 ENCOUNTER — Telehealth (INDEPENDENT_AMBULATORY_CARE_PROVIDER_SITE_OTHER): Payer: Self-pay | Admitting: Orthopaedic Surgery

## 2018-02-23 NOTE — Telephone Encounter (Signed)
I am fine with her trying to put weight on her foot.  I know we are waiting on an MRI to assess her foot for a stress fracture as well as to assess the mass on the plantar aspect of her foot.  There is really no brace to recommend.

## 2018-02-23 NOTE — Telephone Encounter (Signed)
See below

## 2018-02-23 NOTE — Telephone Encounter (Signed)
Patient called having a few questions about her ankle. Wanted to know if she could apply any weight to it, should she wear a brace, etc. CB # [phone]

## 2018-02-24 ENCOUNTER — Other Ambulatory Visit (INDEPENDENT_AMBULATORY_CARE_PROVIDER_SITE_OTHER): Payer: Self-pay

## 2018-02-24 DIAGNOSIS — M25571 Pain in right ankle and joints of right foot: Secondary | ICD-10-CM

## 2018-02-24 NOTE — Telephone Encounter (Signed)
Patient aware of below message

## 2018-03-08 ENCOUNTER — Ambulatory Visit (INDEPENDENT_AMBULATORY_CARE_PROVIDER_SITE_OTHER): Payer: Managed Care, Other (non HMO) | Admitting: Orthopaedic Surgery

## 2018-03-10 ENCOUNTER — Ambulatory Visit
Admission: RE | Admit: 2018-03-10 | Discharge: 2018-03-10 | Disposition: A | Discharge status: Home / Self Care | Payer: Managed Care, Other (non HMO) | Source: Ambulatory Visit | Attending: Orthopaedic Surgery | Admitting: Orthopaedic Surgery

## 2018-03-10 DIAGNOSIS — M25571 Pain in right ankle and joints of right foot: Secondary | ICD-10-CM

## 2018-03-15 NOTE — Progress Notes (Signed)
Subjective:    Patient ID: Cindy [name], female    DOB: 1971/11/05, 47 y.o.   MRN: [id number]  HPI:  Cindy [name] is here to establish as a new pt.  She is a pleasant 47 year old female.  PMH: R ankle/foot pain r/t to injury Oct 2018.  She recently had MRI and will f/u with Ortho/Dr. Ninfa Linden next week. She also reports anxiety and acute increase in sx's r/t to Cindy [name] passing away a few years ago at age 38 from complications from diabetes. She has never been on an antidepressant but has used Valium in the past with good sx control and denied SE. She has been in therapy on/off throughout Cindy life and is agreeable to referral to mental health. 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 She tries to practice yoga several times/week but has recently been limited r/t R foot pain    Patient Care Team    Relationship Specialty Notifications Start End  [place] Grandchild, NP PCP - General Family Medicine  03/16/18   [facility]    03/16/18   [place]ur Rossetti, [place] Physician Orthopedic Surgery  03/16/18     Patient Active Problem List   Diagnosis Date Noted  . Foot mass, right 02/22/2018  . Pain in right ankle and joints of right foot 02/22/2018     Past Medical History:  Diagnosis Date  . Medical history non-contributory      Past Surgical History:  Procedure Laterality Date  . NO PAST SURGERIES       Family History  Problem Relation Age of Onset  . Cancer [name]        brain  . Diabetes [name]   . Cancer Maternal [name]        lung  . Cancer Maternal [name]        brain and bone  . Cancer Paternal [name]        brain     Social History   Substance and Sexual Activity  Drug Use No     Social History   Substance and Sexual Activity  Alcohol Use Yes  . Alcohol/week: 3.6 oz  . Types:  6 Cans of beer per week   Comment: few times a week     Social History   Tobacco Use  Smoking Status Never Smoker  Smokeless Tobacco Never Used     Outpatient Encounter Medications as of 03/16/2018  Medication Sig  . diazepam (VALIUM) 5 MG tablet Take 1 tablet (5 mg total) by mouth every 12 (twelve) hours as needed for anxiety.  . [DISCONTINUED] acetaminophen (TYLENOL) 500 MG tablet Take 500 mg by mouth every 6 (six) hours as needed.  . [DISCONTINUED] ibuprofen (ADVIL,MOTRIN) 200 MG tablet Take 200 mg by mouth every 6 (six) hours as needed for mild pain.  . [DISCONTINUED] Prenatal Vit-Fe Fumarate-FA (PRENATAL MULTIVITAMIN) TABS tablet Take 1 tablet by mouth daily at 12 noon.   No facility-administered encounter medications on file as of 03/16/2018.     Allergies: Latex  Body mass index is 25.5 kg/m.  Blood pressure (!) 157/92, pulse 92, height 5' 2.75" (1.594 m), weight 142 lb 12.8 oz (64.8 kg), last menstrual period 02/22/2018, SpO2 99 %, unknown if currently breastfeeding.   Review of Systems  Constitutional: Positive for fatigue. Negative for activity change, appetite change, chills, diaphoresis, fever and unexpected  weight change.  Eyes: Negative for visual disturbance.  Respiratory: Negative for cough, chest tightness, shortness of breath, wheezing and stridor.   Cardiovascular: Negative for chest pain, palpitations and leg swelling.  Gastrointestinal: Negative for abdominal distention, abdominal pain, blood in stool, constipation, diarrhea, nausea and vomiting.  Endocrine: Negative for cold intolerance, heat intolerance, polydipsia, polyphagia and polyuria.  Genitourinary: Negative for difficulty urinating and flank pain.  Neurological: Negative for dizziness and headaches.  Hematological: Does not bruise/bleed easily.  Psychiatric/Behavioral: Positive for dysphoric mood and sleep disturbance. Negative for confusion, decreased concentration, hallucinations, self-injury  and suicidal ideas. The patient is nervous/anxious. The patient is not hyperactive.        Objective:   Physical Exam  Constitutional: She is oriented to person, place, and time. She appears well-developed and well-nourished.  HENT:  Head: Normocephalic and atraumatic.  Right Ear: External ear normal.  Left Ear: External ear normal.  Eyes: Pupils are equal, round, and reactive to light. Conjunctivae are normal.  Cardiovascular: Normal rate, regular rhythm, normal heart sounds and intact distal pulses.  Pulmonary/Chest: Effort normal and breath sounds normal. No respiratory distress. She has no wheezes. She has no rales. She exhibits no tenderness.  Neurological: She is alert and oriented to person, place, and time.  Skin: Skin is warm and dry. No rash noted. She is not diaphoretic. No erythema. No pallor.  Flushed face  Psychiatric: Cindy behavior is normal. Judgment and thought content normal. Cindy mood appears anxious. Cindy speech is rapid and/or pressured. Cognition and memory are normal.  Nursing note and vitals reviewed.         Assessment & Plan:   1. Anxiety   2. Healthcare maintenance     [place] Controlled Substance Database reviewed- no contraindications noted Diazepam 5mg  BID PRN, 30 ct provided Referral to mental health placed Continue to practice as much yoga as tolerated (R foot pain)  Healthcare maintenance Continue excellent water intake and follow heart healthy diet. Practice yoga as much as can tolerate with right foot pain. Referral to mental health placed. Please use Valium as needed- do not drink alcohol or drive while taking medication. Please schedule fasting lab appt in few weeks, then schedule complete physical a week or so after.    FOLLOW-UP:  Return in about 1 month (around 04/13/2018) for CPE, Fasting Labs.

## 2018-03-16 ENCOUNTER — Encounter: Payer: Self-pay | Admitting: Adult Health

## 2018-03-16 ENCOUNTER — Ambulatory Visit (INDEPENDENT_AMBULATORY_CARE_PROVIDER_SITE_OTHER): Payer: Managed Care, Other (non HMO) | Admitting: Adult Health

## 2018-03-16 VITALS — BP 157/92 | HR 92 | Ht 62.75 in | Wt 142.8 lb

## 2018-03-16 DIAGNOSIS — F419 Anxiety disorder, unspecified: Secondary | ICD-10-CM | POA: Diagnosis not present

## 2018-03-16 DIAGNOSIS — Z0000 Encounter for general adult medical examination without abnormal findings: Secondary | ICD-10-CM | POA: Insufficient documentation

## 2018-03-16 MED ORDER — DIAZEPAM 5 MG PO TABS: 5.0000 mg | ORAL_TABLET | Freq: Two times a day (BID) | ORAL | 0 refills | Status: DC | PRN

## 2018-03-16 NOTE — Assessment & Plan Note (Signed)
Continue excellent water intake and follow heart healthy diet. Practice yoga as much as can tolerate with right foot pain. Referral to mental health placed. Please use Valium as needed- do not drink alcohol or drive while taking medication. Please schedule fasting lab appt in few weeks, then schedule complete physical a week or so after.

## 2018-03-16 NOTE — Patient Instructions (Signed)
Heart-Healthy Eating Plan Heart-healthy meal planning includes:  Limiting unhealthy fats.  Increasing healthy fats.  Making other small dietary changes.  You may need to talk with your doctor or a diet specialist (dietitian) to create an eating plan that is right for you. What types of fat should I choose?  Choose healthy fats. These include olive oil and canola oil, flaxseeds, walnuts, almonds, and seeds.  Eat more omega-3 fats. These include salmon, mackerel, sardines, tuna, flaxseed oil, and ground flaxseeds. Try to eat fish at least twice each week.  Limit saturated fats. ? Saturated fats are often found in animal products, such as meats, butter, and cream. ? Plant sources of saturated fats include palm oil, palm kernel oil, and coconut oil.  Avoid foods with partially hydrogenated oils in them. These include stick margarine, some tub margarines, cookies, crackers, and other baked goods. These contain trans fats. What general guidelines do I need to follow?  Check food labels carefully. Identify foods with trans fats or high amounts of saturated fat.  Fill one half of your plate with vegetables and green salads. Eat 4-5 servings of vegetables per day. A serving of vegetables is: ? 1 cup of raw leafy vegetables. ?  cup of raw or cooked cut-up vegetables. ?  cup of vegetable juice.  Fill one fourth of your plate with whole grains. Look for the word "whole" as the first word in the ingredient list.  Fill one fourth of your plate with lean protein foods.  Eat 4-5 servings of fruit per day. A serving of fruit is: ? One medium whole fruit. ?  cup of dried fruit. ?  cup of fresh, frozen, or canned fruit. ?  cup of 100% fruit juice.  Eat more foods that contain soluble fiber. These include apples, broccoli, carrots, beans, peas, and barley. Try to get 20-30 g of fiber per day.  Eat more home-cooked food. Eat less restaurant, buffet, and fast food.  Limit or avoid  alcohol.  Limit foods high in starch and sugar.  Avoid fried foods.  Avoid frying your food. Try baking, boiling, grilling, or broiling it instead. You can also reduce fat by: ? Removing the skin from poultry. ? Removing all visible fats from meats. ? Skimming the fat off of stews, soups, and gravies before serving them. ? Steaming vegetables in water or broth.  Lose weight if you are overweight.  Eat 4-5 servings of nuts, legumes, and seeds per week: ? One serving of dried beans or legumes equals  cup after being cooked. ? One serving of nuts equals 1 ounces. ? One serving of seeds equals  ounce or one tablespoon.  You may need to keep track of how much salt or sodium you eat. This is especially true if you have high blood pressure. Talk with your doctor or dietitian to get more information. What foods can I eat? Grains Breads, including French, white, pita, wheat, raisin, rye, oatmeal, and Italian. Tortillas that are neither fried nor made with lard or trans fat. Low-fat rolls, including hotdog and hamburger buns and English muffins. Biscuits. Muffins. Waffles. Pancakes. Light popcorn. Whole-grain cereals. Flatbread. Melba toast. Pretzels. Breadsticks. Rusks. Low-fat snacks. Low-fat crackers, including oyster, saltine, matzo, graham, animal, and rye. Rice and pasta, including brown rice and pastas that are made with whole wheat. Vegetables All vegetables. Fruits All fruits, but limit coconut. Meats and Other Protein Sources Lean, well-trimmed beef, veal, pork, and lamb. Chicken and turkey without skin. All fish and shellfish.   Wild duck, rabbit, pheasant, and venison. Egg whites or low-cholesterol egg substitutes. Dried beans, peas, lentils, and tofu. Seeds and most nuts. Dairy Low-fat or nonfat cheeses, including ricotta, string, and mozzarella. Skim or 1% milk that is liquid, powdered, or evaporated. Buttermilk that is made with low-fat milk. Nonfat or low-fat  yogurt. Beverages Mineral water. Diet carbonated beverages. Sweets and Desserts Sherbets and fruit ices. Honey, jam, marmalade, jelly, and syrups. Meringues and gelatins. Pure sugar candy, such as hard candy, jelly beans, gumdrops, mints, marshmallows, and small amounts of dark chocolate. [employer] Eat all sweets and desserts in moderation. Fats and Oils Nonhydrogenated (trans-free) margarines. Vegetable oils, including soybean, sesame, sunflower, olive, peanut, safflower, corn, canola, and cottonseed. Salad dressings or mayonnaise made with a vegetable oil. Limit added fats and oils that you use for cooking, baking, salads, and as spreads. Other Cocoa powder. Coffee and tea. All seasonings and condiments. The items listed above may not be a complete list of recommended foods or beverages. Contact your dietitian for more options. What foods are not recommended? Grains Breads that are made with saturated or trans fats, oils, or whole milk. Croissants. Butter rolls. Cheese breads. Sweet rolls. Donuts. Buttered popcorn. Chow mein noodles. High-fat crackers, such as cheese or butter crackers. Meats and Other Protein Sources Fatty meats, such as hotdogs, short ribs, sausage, spareribs, bacon, rib eye roast or steak, and mutton. High-fat deli meats, such as salami and bologna. Caviar. Domestic duck and goose. Organ meats, such as kidney, liver, sweetbreads, and heart. Dairy Cream, sour cream, cream cheese, and creamed cottage cheese. Whole-milk cheeses, including blue (bleu), Monte[place] Jack, [place], [place], American, [place], Swiss, cheddar, [place], and [place]. Whole or 2% milk that is liquid, evaporated, or condensed. Whole buttermilk. Cream sauce or high-fat cheese sauce. Yogurt that is made from whole milk. Beverages Regular sodas and juice drinks with added sugar. Sweets and Desserts Frosting. Pudding. Cookies. Cakes other than angel food cake. Candy that has milk chocolate or white  chocolate, hydrogenated fat, butter, coconut, or unknown ingredients. Buttered syrups. Full-fat ice cream or ice cream drinks. Fats and Oils Gravy that has suet, meat fat, or shortening. Cocoa butter, hydrogenated oils, palm oil, coconut oil, palm kernel oil. These can often be found in baked products, candy, fried foods, nondairy creamers, and whipped toppings. Solid fats and shortenings, including bacon fat, salt pork, lard, and butter. Nondairy cream substitutes, such as coffee creamers and sour cream substitutes. Salad dressings that are made of unknown oils, cheese, or sour cream. The items listed above may not be a complete list of foods and beverages to avoid. Contact your dietitian for more information. This information is not intended to replace advice given to you by your health care provider. Make sure you discuss any questions you have with your health care provider. Document Released: 06/13/2012 Document Revised: 05/20/2016 Document Reviewed: 06/06/2014 Elsevier Interactive Patient Education  2018 [place] After being diagnosed with an anxiety disorder, you may be relieved to know why you have felt or behaved a certain way. It is natural to also feel overwhelmed about the treatment ahead and what it will mean for your life. With care and support, you can manage this condition and recover from it. How to cope with anxiety Dealing with stress Stress is your body's reaction to life changes and events, both good and bad. Stress can last just a few hours or it can be ongoing. Stress can play a major role in  anxiety, so it is important to learn both how to cope with stress and how to think about it differently. Talk with your health care provider or a counselor to learn more about stress reduction. He or she may suggest some stress reduction techniques, such as:  Music therapy. This can include creating or listening to music that you enjoy and that inspires  you.  Mindfulness-based meditation. This involves being aware of your normal breaths, rather than trying to control your breathing. It can be done while sitting or walking.  Centering prayer. This is a kind of meditation that involves focusing on a word, phrase, or sacred image that is meaningful to you and that brings you peace.  Deep breathing. To do this, expand your stomach and inhale slowly through your nose. Hold your breath for 3-5 seconds. Then exhale slowly, allowing your stomach muscles to relax.  Self-talk. This is a skill where you identify thought patterns that lead to anxiety reactions and correct those thoughts.  Muscle relaxation. This involves tensing muscles then relaxing them.  Choose a stress reduction technique that fits your lifestyle and personality. Stress reduction techniques take time and practice. Set aside 5-15 minutes a day to do them. Therapists can offer training in these techniques. The training may be covered by some insurance plans. Other things you can do to manage stress include:  Keeping a stress diary. This can help you learn what triggers your stress and ways to control your response.  Thinking about how you respond to certain situations. You may not be able to control everything, but you can control your reaction.  Making time for activities that help you relax, and not feeling guilty about spending your time in this way.  Therapy combined with coping and stress-reduction skills provides the best chance for successful treatment. Medicines Medicines can help ease symptoms. Medicines for anxiety include:  Anti-anxiety drugs.  Antidepressants.  Beta-blockers.  Medicines may be used as the main treatment for anxiety disorder, along with therapy, or if other treatments are not working. Medicines should be prescribed by a health care provider. Relationships Relationships can play a big part in helping you recover. Try to spend more time connecting  with trusted friends and family members. Consider going to couples counseling, taking family education classes, or going to family therapy. Therapy can help you and others better understand the condition. How to recognize changes in your condition Everyone has a different response to treatment for anxiety. Recovery from anxiety happens when symptoms decrease and stop interfering with your daily activities at home or work. This may mean that you will start to:  Have better concentration and focus.  Sleep better.  Be less irritable.  Have more energy.  Have improved memory.  It is important to recognize when your condition is getting worse. Contact your health care provider if your symptoms interfere with home or work and you do not feel like your condition is improving. Where to find help and support: You can get help and support from these sources:  Self-help groups.  Online and [employer].  A trusted spiritual leader.  Couples counseling.  Family education classes.  Family therapy.  Follow these instructions at home:  Eat a healthy diet that includes plenty of vegetables, fruits, whole grains, low-fat dairy products, and lean protein. Do not eat a lot of foods that are high in solid fats, added sugars, or salt.  Exercise. Most adults should do the following: ? Exercise for at least 150 minutes  each week. The exercise should increase your heart rate and make you sweat (moderate-intensity exercise). ? Strengthening exercises at least twice a week.  Cut down on caffeine, tobacco, alcohol, and other potentially harmful substances.  Get the right amount and quality of sleep. Most adults need 7-9 hours of sleep each night.  Make choices that simplify your life.  Take over-the-counter and prescription medicines only as told by your health care provider.  Avoid caffeine, alcohol, and certain over-the-counter cold medicines. These may make you feel worse. Ask your  pharmacist which medicines to avoid.  Keep all follow-up visits as told by your health care provider. This is important. Questions to ask your health care provider  Would I benefit from therapy?  How often should I follow up with a health care provider?  How long do I need to take medicine?  Are there any long-term side effects of my medicine?  Are there any alternatives to taking medicine? Contact a health care provider if:  You have a hard time staying focused or finishing daily tasks.  You spend many hours a day feeling worried about everyday life.  You become exhausted by worry.  You start to have headaches, feel tense, or have nausea.  You urinate more than normal.  You have diarrhea. Get help right away if:  You have a racing heart and shortness of breath.  You have thoughts of hurting yourself or others. If you ever feel like you may hurt yourself or others, or have thoughts about taking your own life, get help right away. You can go to your nearest emergency department or call:  Your local emergency services (911 in the [place]  A suicide crisis helpline, such as the [facility] at 603 558 4406. This is open 24-hours a day.  Summary  Taking steps to deal with stress can help calm you.  Medicines cannot cure anxiety disorders, but they can help ease symptoms.  Family, friends, and partners can play a big part in helping you recover from an anxiety disorder. This information is not intended to replace advice given to you by your health care provider. Make sure you discuss any questions you have with your health care provider. Document Released: 12/07/2016 Document Revised: 12/07/2016 Document Reviewed: 12/07/2016 Elsevier Interactive Patient Education  2018 [place] excellent water intake and follow heart healthy diet. Practice yoga as much as can tolerate with right foot pain. Referral to mental health placed. Please  use Valium as needed- do not drink alcohol or drive while taking medication. Please schedule fasting lab appt in few weeks, then schedule complete physical a week or so after. WELCOME TO THE PRACTICE!

## 2018-03-16 NOTE — Assessment & Plan Note (Addendum)
[place] Controlled Substance Database reviewed- no contraindications noted Diazepam 5mg  BID PRN, 30 ct provided Referral to mental health placed Continue to practice as much yoga as tolerated (R foot pain)

## 2018-03-22 ENCOUNTER — Ambulatory Visit (INDEPENDENT_AMBULATORY_CARE_PROVIDER_SITE_OTHER): Payer: Managed Care, Other (non HMO) | Admitting: Orthopaedic Surgery

## 2018-03-30 ENCOUNTER — Other Ambulatory Visit: Payer: Managed Care, Other (non HMO)

## 2018-04-03 ENCOUNTER — Other Ambulatory Visit (INDEPENDENT_AMBULATORY_CARE_PROVIDER_SITE_OTHER): Payer: Self-pay

## 2018-04-03 ENCOUNTER — Ambulatory Visit (INDEPENDENT_AMBULATORY_CARE_PROVIDER_SITE_OTHER): Payer: Managed Care, Other (non HMO) | Admitting: Orthopaedic Surgery

## 2018-04-03 ENCOUNTER — Encounter (INDEPENDENT_AMBULATORY_CARE_PROVIDER_SITE_OTHER): Payer: Self-pay | Admitting: Orthopaedic Surgery

## 2018-04-03 DIAGNOSIS — M25571 Pain in right ankle and joints of right foot: Secondary | ICD-10-CM

## 2018-04-03 NOTE — Progress Notes (Signed)
The patient is still complaining of right great toe pain and swelling.  She has had some off and on pains for some period of time but then October when she actually kicked a door she had severe pain since then.  I can palpate swelling along the plantar fascia almost as if there was a little mass in the tissue there she has significant pain at her MTP joint of her great toe and swelling.  She is here for review after I sent her for an MRI to assess the mass and to look more closely at the right great toe MTP joint.  She is still wearing open back shoes with no support because of swelling.  She does have compressive garments at home.  On exam she still has significant pain with hyperflexion and hyperextension of the MTP joint with swelling in her great toe on the right side.  Most of her pain is distal to the joint though.  There is palpable mass along the plantar fascia.  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  They only see degenerative changes of the plantar fascia but no specific mass that I can locate on the MRI.  This point I would like to send her to the [facility] for consultation as well as further evaluation and treatment of her right great toe pain.  She understands this and would like this referral.  Hopefully their expertise could help determine whether or not she would benefit from some type of insert and better shoe wear versus worst-case scenarios surgical intervention.

## 2018-04-05 ENCOUNTER — Encounter: Payer: Managed Care, Other (non HMO) | Admitting: Adult Health

## 2018-04-18 ENCOUNTER — Other Ambulatory Visit: Payer: Self-pay

## 2018-04-25 ENCOUNTER — Ambulatory Visit: Payer: Managed Care, Other (non HMO) | Admitting: Podiatry

## 2018-04-25 ENCOUNTER — Encounter: Payer: Self-pay | Admitting: Adult Health

## 2018-06-12 ENCOUNTER — Ambulatory Visit: Payer: 59 | Admitting: Licensed Clinical Social Worker

## 2019-03-19 ENCOUNTER — Telehealth: Payer: Self-pay
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
## 2019-07-02 NOTE — Progress Notes (Signed)
Subjective:    Patient ID: Cindy [name], female    DOB: 1971/09/20, 48 y.o.   MRN: [id number]  HPI: 03/16/2018 OV: Cindy [name] is here to establish as a new pt.  She is a pleasant 48 year old female.  PMH: R ankle/foot pain r/t to injury Oct 2018.  She recently had MRI and will f/u with Ortho/Dr. Ninfa Linden next week. She also reports anxiety and acute increase in sx's r/t to her brother passing away a few years ago at age 45 from complications from diabetes. She has never been on an antidepressant but has used Valium in the past with good sx control and denied SE. She has been in therapy on/off throughout her life and is agreeable to referral to mental health. 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 She tries to practice yoga several times/week but has recently been limited r/t R foot pain  07/03/2019 OV:  Cindy [name] is here for CPE Since her initial OV March 2019 she has been seen by Orthopedic Specialist for R great toe pain 03/10/2018 MRI-  IMPRESSION: Focal subtle degeneration of plantar fascia just proximal to the head of the first metatarsal and just superficial to the flexor hallucis longus tendon. Slight arthritis of the first MTP joint.  She has stopped drinking ETOH- last consumption was April 2020- when her husband entered [employer]. She was slowly escalating drinking over the years in response to his alcoholism. She reports consuming up to 12 [employer].  She reports reduction in overall anxiety- has not used Valium in >6 months  She has been increasing daily water intake and trying to reduce processed food. She walks daily, but due to diffuse muscle/joint aches has stopped regular yoga practice. She denies first degree family hx of RA any other auto-immune disorders  Fasting Labs Obtained today  Healthcare  Maintenance: PAP-UTD, last 04/27/2017, normal Mammogram-pt will schedule Immunizations-UTD  Patient Care Team    Relationship Specialty Notifications Start End  Esaw Grandchild, NP PCP - General Family Medicine  03/16/18   [facility]    03/16/18   Mcarthur Rossetti, MD Consulting Physician Orthopedic Surgery  03/16/18     Patient Active Problem List   Diagnosis Date Noted  . Anxiety 03/16/2018  . Healthcare maintenance 03/16/2018  . Foot mass, right 02/22/2018  . Pain in right ankle and joints of right foot 02/22/2018     Past Medical History:  Diagnosis Date  . Medical history non-contributory      Past Surgical History:  Procedure Laterality Date  . NO PAST SURGERIES       Family History  Problem Relation Age of Onset  . Cancer Father        brain  . Diabetes Brother   . Cancer Maternal Grandmother        lung  . Cancer Maternal Grandfather        brain and bone  . Cancer Paternal Grandfather        brain     Social History   Substance and Sexual Activity  Drug Use No     Social History   Substance and Sexual Activity  Alcohol Use Yes  . Alcohol/week: 6.0 standard drinks  . Types: 6 Cans of beer per week   Comment: few times a week     Social History   Tobacco  Use  Smoking Status Never Smoker  Smokeless Tobacco Never Used     Outpatient Encounter Medications as of 07/03/2019  Medication Sig  . [DISCONTINUED] diazepam (VALIUM) 5 MG tablet Take 1 tablet (5 mg total) by mouth every 12 (twelve) hours as needed for anxiety.   No facility-administered encounter medications on file as of 07/03/2019.     Allergies: Latex  Body mass index is 26.59 kg/m.  Blood pressure 127/84, pulse 80, temperature 98.8 F (37.1 C), temperature source Oral, height 5' 2.75" (1.594 m), weight 148 lb 14.4 oz (67.5 kg), last menstrual period 06/29/2019, SpO2 100 %, unknown if currently breastfeeding.     Review of Systems   Constitutional: Positive for fatigue. Negative for activity change, appetite change, chills, diaphoresis, fever and unexpected weight change.  HENT: Negative for congestion.   Eyes: Negative for visual disturbance.  Respiratory: Negative for cough, chest tightness, shortness of breath, wheezing and stridor.   Cardiovascular: Negative for chest pain, palpitations and leg swelling.  Gastrointestinal: Negative for abdominal distention, anal bleeding, blood in stool, constipation, diarrhea, nausea and vomiting.  Endocrine: Negative for cold intolerance, heat intolerance, polydipsia, polyphagia and polyuria.  Genitourinary: Negative for difficulty urinating and enuresis.  Musculoskeletal: Positive for arthralgias and myalgias. Negative for back pain, gait problem, joint swelling, neck pain and neck stiffness.  Skin: Negative for color change, pallor, rash and wound.  Neurological: Negative for dizziness and facial asymmetry.  Hematological: Negative for adenopathy. Does not bruise/bleed easily.  Psychiatric/Behavioral: Negative for agitation, behavioral problems, confusion, decreased concentration, dysphoric mood, hallucinations, self-injury, sleep disturbance and suicidal ideas. The patient is nervous/anxious. The patient is not hyperactive.        Objective:   Physical Exam Vitals signs and nursing note reviewed.  Constitutional:      General: She is not in acute distress.    Appearance: She is normal weight. She is not ill-appearing, toxic-appearing or diaphoretic.  HENT:     Head: Normocephalic and atraumatic.     Right Ear: Tympanic membrane, ear canal and external ear normal. There is no impacted cerumen.     Left Ear: Tympanic membrane, ear canal and external ear normal. There is no impacted cerumen.     Nose: Nose normal. No congestion.     Mouth/Throat:     Mouth: Mucous membranes are moist.     Pharynx: No oropharyngeal exudate or posterior oropharyngeal erythema.  Eyes:      Extraocular Movements: Extraocular movements intact.     Conjunctiva/sclera: Conjunctivae normal.     Pupils: Pupils are equal, round, and reactive to light.  Neck:     Musculoskeletal: Normal range of motion. No muscular tenderness.  Cardiovascular:     Rate and Rhythm: Normal rate.     Pulses: Normal pulses.     Heart sounds: Normal heart sounds. No murmur. No friction rub. No gallop.   Pulmonary:     Effort: Pulmonary effort is normal. No respiratory distress.     Breath sounds: Normal breath sounds. No stridor. No wheezing, rhonchi or rales.  Chest:     Chest wall: No tenderness.  Abdominal:     General: Abdomen is flat. Bowel sounds are normal. There is no distension.     Palpations: Abdomen is soft. There is no mass.     Tenderness: There is no abdominal tenderness. There is no right CVA tenderness, left CVA tenderness, guarding or rebound.     Hernia: No hernia is present.  Musculoskeletal: Normal range of motion.  General: No swelling or tenderness.     Comments: Base of R great toe- palpable, slightly tender mass   Skin:    General: Skin is warm and dry.     Capillary Refill: Capillary refill takes less than 2 seconds.     Coloration: Skin is not pale.  Neurological:     Mental Status: She is alert and oriented to person, place, and time.     Coordination: Coordination normal.  Psychiatric:        Attention and Perception: Attention normal.        Mood and Affect: Mood is anxious.        Speech: Speech normal. Speech is not rapid and pressured.        Behavior: Behavior normal. Behavior is cooperative.        Thought Content: Thought content normal.        Cognition and Memory: Cognition and memory normal.        Judgment: Judgment normal.       Assessment & Plan:   1. Screening for HIV (human immunodeficiency virus)   2. Healthcare maintenance   3. Anxiety     Healthcare maintenance GREAT JOB on stopping alcohol use! Your blood pressure and weight are  stable. We will call you when your lab results are available. Please follow-up with your OB/GYN soon. Increase regular exercise- walking, yoga. If diffuse stiffness/joint aches do not improve in a few months- please schedule a follow-up. Continue to social distance and wear a mask when in public.  Anxiety Stable, not requiring medication.     FOLLOW-UP:  Return in about 1 year (around 07/02/2020) for CPE, Fasting Labs.

## 2019-07-03 ENCOUNTER — Ambulatory Visit (INDEPENDENT_AMBULATORY_CARE_PROVIDER_SITE_OTHER): Payer: Managed Care, Other (non HMO) | Admitting: Adult Health

## 2019-07-03 ENCOUNTER — Encounter: Payer: Self-pay | Admitting: Adult Health

## 2019-07-03 ENCOUNTER — Other Ambulatory Visit: Payer: Self-pay

## 2019-07-03 VITALS — BP 127/84 | HR 80 | Temp 98.8°F | Ht 62.75 in | Wt 148.9 lb

## 2019-07-03 DIAGNOSIS — Z0000 Encounter for general adult medical examination without abnormal findings: Secondary | ICD-10-CM

## 2019-07-03 DIAGNOSIS — Z114 Encounter for screening for human immunodeficiency virus [HIV]: Secondary | ICD-10-CM | POA: Diagnosis not present

## 2019-07-03 DIAGNOSIS — F419 Anxiety disorder, unspecified: Secondary | ICD-10-CM

## 2019-07-03 NOTE — Patient Instructions (Signed)
Preventive Care for Adults, Female  A healthy lifestyle and preventive care can promote health and wellness. Preventive health guidelines for women include the following key practices.   A routine yearly physical is a good way to check with your health care provider about your health and preventive screening. It is a chance to share any concerns and updates on your health and to receive a thorough exam.   Visit your dentist for a routine exam and preventive care every 6 months. Brush your teeth twice a day and floss once a day. Good oral hygiene prevents tooth decay and gum disease.   The frequency of eye exams is based on your age, health, family medical history, use of contact lenses, and other factors. Follow your health care provider's recommendations for frequency of eye exams.   Eat a healthy diet. Foods like vegetables, fruits, whole grains, low-fat dairy products, and lean protein foods contain the nutrients you need without too many calories. Decrease your intake of foods high in solid fats, added sugars, and salt. Eat the right amount of calories for you.Get information about a proper diet from your health care provider, if necessary.   Regular physical exercise is one of the most important things you can do for your health. Most adults should get at least 150 minutes of moderate-intensity exercise (any activity that increases your heart rate and causes you to sweat) each week. In addition, most adults need muscle-strengthening exercises on 2 or more days a week.   Maintain a healthy weight. The body mass index (BMI) is a screening tool to identify possible weight problems. It provides an estimate of body fat based on height and weight. Your health care provider can find your BMI, and can help you achieve or maintain a healthy weight.For adults 20 years and older:   - A BMI below 18.5 is considered underweight.   - A BMI of 18.5 to 24.9 is normal.   - A BMI of 25 to 29.9 is  considered overweight.   - A BMI of 30 and above is considered obese.   Maintain normal blood lipids and cholesterol levels by exercising and minimizing your intake of trans and saturated fats.  Eat a balanced diet with plenty of fruit and vegetables. Blood tests for lipids and cholesterol should begin at age 20 and be repeated every 5 years minimum.  If your lipid or cholesterol levels are high, you are over 40, or you are at high risk for heart disease, you may need your cholesterol levels checked more frequently.Ongoing high lipid and cholesterol levels should be treated with medicines if diet and exercise are not working.   If you smoke, find out from your health care provider how to quit. If you do not use tobacco, do not start.   Lung cancer screening is recommended for adults aged 55-80 years who are at high risk for developing lung cancer because of a history of smoking. A yearly low-dose CT scan of the lungs is recommended for people who have at least a 30-pack-year history of smoking and are a current smoker or have quit within the past 15 years. A pack year of smoking is smoking an average of 1 pack of cigarettes a day for 1 year (for example: 1 pack a day for 30 years or 2 packs a day for 15 years). Yearly screening should continue until the smoker has stopped smoking for at least 15 years. Yearly screening should be stopped for people who develop a   health problem that would prevent them from having lung cancer treatment.   If you are pregnant, do not drink alcohol. If you are breastfeeding, be very cautious about drinking alcohol. If you are not pregnant and choose to drink alcohol, do not have more than 1 drink per day. One drink is considered to be 12 ounces (355 mL) of beer, 5 ounces (148 mL) of wine, or 1.5 ounces (44 mL) of liquor.   Avoid use of street drugs. Do not share needles with anyone. Ask for help if you need support or instructions about stopping the use of  drugs.   High blood pressure causes heart disease and increases the risk of stroke. Your blood pressure should be checked at least yearly.  Ongoing high blood pressure should be treated with medicines if weight loss and exercise do not work.   If you are 69-55 years old, ask your health care provider if you should take aspirin to prevent strokes.   Diabetes screening involves taking a blood sample to check your fasting blood sugar level. This should be done once every 3 years, after age 38, if you are within normal weight and without risk factors for diabetes. Testing should be considered at a younger age or be carried out more frequently if you are overweight and have at least 1 risk factor for diabetes.   Breast cancer screening is essential preventive care for women. You should practice "breast self-awareness."  This means understanding the normal appearance and feel of your breasts and may include breast self-examination.  Any changes detected, no matter how small, should be reported to a health care provider.  Women in their 80s and 30s should have a clinical breast exam (CBE) by a health care provider as part of a regular health exam every 1 to 3 years.  After age 66, women should have a CBE every year.  Starting at age 1, women should consider having a mammogram (breast X-ray test) every year.  Women who have a family history of breast cancer should talk to their health care provider about genetic screening.  Women at a high risk of breast cancer should talk to their health care providers about having an MRI and a mammogram every year.   -Breast cancer gene (BRCA)-related cancer risk assessment is recommended for women who have family members with BRCA-related cancers. BRCA-related cancers include breast, ovarian, tubal, and peritoneal cancers. Having family members with these cancers may be associated with an increased risk for harmful changes (mutations) in the breast cancer genes BRCA1 and  BRCA2. Results of the assessment will determine the need for genetic counseling and BRCA1 and BRCA2 testing.   The Pap test is a screening test for cervical cancer. A Pap test can show cell changes on the cervix that might become cervical cancer if left untreated. A Pap test is a procedure in which cells are obtained and examined from the lower end of the uterus (cervix).   - Women should have a Pap test starting at age 57.   - Between ages 90 and 70, Pap tests should be repeated every 2 years.   - Beginning at age 63, you should have a Pap test every 3 years as long as the past 3 Pap tests have been normal.   - Some women have medical problems that increase the chance of getting cervical cancer. Talk to your health care provider about these problems. It is especially important to talk to your health care provider if a  new problem develops soon after your last Pap test. In these cases, your health care provider may recommend more frequent screening and Pap tests.   - The above recommendations are the same for women who have or have not gotten the vaccine for human papillomavirus (HPV).   - If you had a hysterectomy for a problem that was not cancer or a condition that could lead to cancer, then you no longer need Pap tests. Even if you no longer need a Pap test, a regular exam is a good idea to make sure no other problems are starting.   - If you are between ages 36 and 66 years, and you have had normal Pap tests going back 10 years, you no longer need Pap tests. Even if you no longer need a Pap test, a regular exam is a good idea to make sure no other problems are starting.   - If you have had past treatment for cervical cancer or a condition that could lead to cancer, you need Pap tests and screening for cancer for at least 20 years after your treatment.   - If Pap tests have been discontinued, risk factors (such as a new sexual partner) need to be reassessed to determine if screening should  be resumed.   - The HPV test is an additional test that may be used for cervical cancer screening. The HPV test looks for the virus that can cause the cell changes on the cervix. The cells collected during the Pap test can be tested for HPV. The HPV test could be used to screen women aged 70 years and older, and should be used in women of any age who have unclear Pap test results. After the age of 67, women should have HPV testing at the same frequency as a Pap test.   Colorectal cancer can be detected and often prevented. Most routine colorectal cancer screening begins at the age of 57 years and continues through age 26 years. However, your health care provider may recommend screening at an earlier age if you have risk factors for colon cancer. On a yearly basis, your health care provider may provide home test kits to check for hidden blood in the stool.  Use of a small camera at the end of a tube, to directly examine the colon (sigmoidoscopy or colonoscopy), can detect the earliest forms of colorectal cancer. Talk to your health care provider about this at age 23, when routine screening begins. Direct exam of the colon should be repeated every 5 -10 years through age 49 years, unless early forms of pre-cancerous polyps or small growths are found.   People who are at an increased risk for hepatitis B should be screened for this virus. You are considered at high risk for hepatitis B if:  -You were born in a country where hepatitis B occurs often. Talk with your health care provider about which countries are considered high risk.  - Your parents were born in a high-risk country and you have not received a shot to protect against hepatitis B (hepatitis B vaccine).  - You have HIV or AIDS.  - You use needles to inject street drugs.  - You live with, or have sex with, someone who has Hepatitis B.  - You get hemodialysis treatment.  - You take certain medicines for conditions like cancer, organ  transplantation, and autoimmune conditions.   Hepatitis C blood testing is recommended for all people born from [birth year] through 1965 and any individual  with known risks for hepatitis C.   Practice safe sex. Use condoms and avoid high-risk sexual practices to reduce the spread of sexually transmitted infections (STIs). STIs include gonorrhea, chlamydia, syphilis, trichomonas, herpes, HPV, and human immunodeficiency virus (HIV). Herpes, HIV, and HPV are viral illnesses that have no cure. They can result in disability, cancer, and death. Sexually active women aged 25 years and younger should be checked for chlamydia. Older women with new or multiple partners should also be tested for chlamydia. Testing for other STIs is recommended if you are sexually active and at increased risk.   Osteoporosis is a disease in which the bones lose minerals and strength with aging. This can result in serious bone fractures or breaks. The risk of osteoporosis can be identified using a bone density scan. Women ages 65 years and over and women at risk for fractures or osteoporosis should discuss screening with their health care providers. Ask your health care provider whether you should take a calcium supplement or vitamin D to There are also several preventive steps women can take to avoid osteoporosis and resulting fractures or to keep osteoporosis from worsening. -->Recommendations include:  Eat a balanced diet high in fruits, vegetables, calcium, and vitamins.  Get enough calcium. The recommended total intake of is 1,200 mg daily; for best absorption, if taking supplements, divide doses into 250-500 mg doses throughout the day. Of the two types of calcium, calcium carbonate is best absorbed when taken with food but calcium citrate can be taken on an empty stomach.  Get enough vitamin D. NAMS and the National Osteoporosis Foundation recommend at least 1,000 IU per day for women age 50 and over who are at risk of vitamin D  deficiency. Vitamin D deficiency can be caused by inadequate sun exposure (for example, those who live in northern latitudes).  Avoid alcohol and smoking. Heavy alcohol intake (more than 7 drinks per week) increases the risk of falls and hip fracture and women smokers tend to lose bone more rapidly and have lower bone mass than nonsmokers. Stopping smoking is one of the most important changes women can make to improve their health and decrease risk for disease.  Be physically active every day. Weight-bearing exercise (for example, fast walking, hiking, jogging, and weight training) may strengthen bones or slow the rate of bone loss that comes with aging. Balancing and muscle-strengthening exercises can reduce the risk of falling and fracture.  Consider therapeutic medications. Currently, several types of effective drugs are available. Healthcare providers can recommend the type most appropriate for each woman.  Eliminate environmental factors that may contribute to accidents. Falls cause nearly 90% of all osteoporotic fractures, so reducing this risk is an important bone-health strategy. Measures include ample lighting, removing obstructions to walking, using nonskid rugs on floors, and placing mats and/or grab bars in showers.  Be aware of medication side effects. Some common medicines make bones weaker. These include a type of steroid drug called glucocorticoids used for arthritis and asthma, some antiseizure drugs, certain sleeping pills, treatments for endometriosis, and some cancer drugs. An overactive thyroid gland or using too much thyroid hormone for an underactive thyroid can also be a problem. If you are taking these medicines, talk to your doctor about what you can do to help protect your bones.reduce the rate of osteoporosis.    Menopause can be associated with physical symptoms and risks. Hormone replacement therapy is available to decrease symptoms and risks. You should talk to your  health care provider   about whether hormone replacement therapy is right for you.   Use sunscreen. Apply sunscreen liberally and repeatedly throughout the day. You should seek shade when your shadow is shorter than you. Protect yourself by wearing long sleeves, pants, a wide-brimmed hat, and sunglasses year round, whenever you are outdoors.   Once a month, do a whole body skin exam, using a mirror to look at the skin on your back. Tell your health care provider of new moles, moles that have irregular borders, moles that are larger than a pencil eraser, or moles that have changed in shape or color.   -Stay current with required vaccines (immunizations).   Influenza vaccine. All adults should be immunized every year.  Tetanus, diphtheria, and acellular pertussis (Td, Tdap) vaccine. Pregnant women should receive 1 dose of Tdap vaccine during each pregnancy. The dose should be obtained regardless of the length of time since the last dose. Immunization is preferred during the 27th 36th week of gestation. An adult who has not previously received Tdap or who does not know her vaccine status should receive 1 dose of Tdap. This initial dose should be followed by tetanus and diphtheria toxoids (Td) booster doses every 10 years. Adults with an unknown or incomplete history of completing a 3-dose immunization series with Td-containing vaccines should begin or complete a primary immunization series including a Tdap dose. Adults should receive a Td booster every 10 years.  Varicella vaccine. An adult without evidence of immunity to varicella should receive 2 doses or a second dose if she has previously received 1 dose. Pregnant females who do not have evidence of immunity should receive the first dose after pregnancy. This first dose should be obtained before leaving the health care facility. The second dose should be obtained 4 8 weeks after the first dose.  Human papillomavirus (HPV) vaccine. Females aged 13 26  years who have not received the vaccine previously should obtain the 3-dose series. The vaccine is not recommended for use in pregnant females. However, pregnancy testing is not needed before receiving a dose. If a female is found to be pregnant after receiving a dose, no treatment is needed. In that case, the remaining doses should be delayed until after the pregnancy. Immunization is recommended for any person with an immunocompromised condition through the age of 26 years if she did not get any or all doses earlier. During the 3-dose series, the second dose should be obtained 4 8 weeks after the first dose. The third dose should be obtained 24 weeks after the first dose and 16 weeks after the second dose.  Zoster vaccine. One dose is recommended for adults aged 60 years or older unless certain conditions are present.  Measles, mumps, and rubella (MMR) vaccine. Adults born before 1957 generally are considered immune to measles and mumps. Adults born in 1957 or later should have 1 or more doses of MMR vaccine unless there is a contraindication to the vaccine or there is laboratory evidence of immunity to each of the three diseases. A routine second dose of MMR vaccine should be obtained at least 28 days after the first dose for students attending postsecondary schools, health care workers, or international travelers. People who received inactivated measles vaccine or an unknown type of measles vaccine during 1963 1967 should receive 2 doses of MMR vaccine. People who received inactivated mumps vaccine or an unknown type of mumps vaccine before 1979 and are at high risk for mumps infection should consider immunization with 2 doses of   MMR vaccine. For females of childbearing age, rubella immunity should be determined. If there is no evidence of immunity, females who are not pregnant should be vaccinated. If there is no evidence of immunity, females who are pregnant should delay immunization until after pregnancy.  Unvaccinated health care workers born before [birth year] who lack laboratory evidence of measles, mumps, or rubella immunity or laboratory confirmation of disease should consider measles and mumps immunization with 2 doses of MMR vaccine or rubella immunization with 1 dose of MMR vaccine.  Pneumococcal 13-valent conjugate (PCV13) vaccine. When indicated, a person who is uncertain of her immunization history and has no record of immunization should receive the PCV13 vaccine. An adult aged 54 years or older who has certain medical conditions and has not been previously immunized should receive 1 dose of PCV13 vaccine. This PCV13 should be followed with a dose of pneumococcal polysaccharide (PPSV23) vaccine. The PPSV23 vaccine dose should be obtained at least 8 weeks after the dose of PCV13 vaccine. An adult aged 58 years or older who has certain medical conditions and previously received 1 or more doses of PPSV23 vaccine should receive 1 dose of PCV13. The PCV13 vaccine dose should be obtained 1 or more years after the last PPSV23 vaccine dose.  Pneumococcal polysaccharide (PPSV23) vaccine. When PCV13 is also indicated, PCV13 should be obtained first. All adults aged 58 years and older should be immunized. An adult younger than age 65 years who has certain medical conditions should be immunized. Any person who resides in a nursing home or long-term care facility should be immunized. An adult smoker should be immunized. People with an immunocompromised condition and certain other conditions should receive both PCV13 and PPSV23 vaccines. People with human immunodeficiency virus (HIV) infection should be immunized as soon as possible after diagnosis. Immunization during chemotherapy or radiation therapy should be avoided. Routine use of PPSV23 vaccine is not recommended for American Indians, [place] Natives, or people younger than 65 years unless there are medical conditions that require PPSV23 vaccine. When indicated,  people who have unknown immunization and have no record of immunization should receive PPSV23 vaccine. One-time revaccination 5 years after the first dose of PPSV23 is recommended for people aged 70 64 years who have chronic kidney failure, nephrotic syndrome, asplenia, or immunocompromised conditions. People who received 1 2 doses of PPSV23 before age 32 years should receive another dose of PPSV23 vaccine at age 96 years or later if at least 5 years have passed since the previous dose. Doses of PPSV23 are not needed for people immunized with PPSV23 at or after age 55 years.  Meningococcal vaccine. Adults with asplenia or persistent complement component deficiencies should receive 2 doses of quadrivalent meningococcal conjugate (MenACWY-D) vaccine. The doses should be obtained at least 2 months apart. Microbiologists working with certain meningococcal bacteria, [employer] recruits, people at risk during an outbreak, and people who travel to or live in countries with a high rate of meningitis should be immunized. A first-year college student up through age 58 years who is living in a residence hall should receive a dose if she did not receive a dose on or after her 16th birthday. Adults who have certain high-risk conditions should receive one or more doses of vaccine.  Hepatitis A vaccine. Adults who wish to be protected from this disease, have certain high-risk conditions, work with hepatitis A-infected animals, work in hepatitis A research labs, or travel to or work in countries with a high rate of hepatitis A should be  immunized. Adults who were previously unvaccinated and who anticipate close contact with an international adoptee during the first 60 days after arrival in the [place] from a country with a high rate of hepatitis A should be immunized.  Hepatitis B vaccine.  Adults who wish to be protected from this disease, have certain high-risk conditions, may be exposed to blood or other infectious  body fluids, are household contacts or sex partners of hepatitis B positive people, are clients or workers in certain care facilities, or travel to or work in countries with a high rate of hepatitis B should be immunized.  Haemophilus influenzae type b (Hib) vaccine. A previously unvaccinated person with asplenia or sickle cell disease or having a scheduled splenectomy should receive 1 dose of Hib vaccine. Regardless of previous immunization, a recipient of a hematopoietic stem cell transplant should receive a 3-dose series 6 12 months after her successful transplant. Hib vaccine is not recommended for adults with HIV infection.  Preventive Services / Frequency Ages 6 to 39years  Blood pressure check.** / Every 1 to 2 years.  Lipid and cholesterol check.** / Every 5 years beginning at age 39.  Clinical breast exam.** / Every 3 years for women in their 61s and 62s.  BRCA-related cancer risk assessment.** / For women who have family members with a BRCA-related cancer (breast, ovarian, tubal, or peritoneal cancers).  Pap test.** / Every 2 years from ages 47 through 85. Every 3 years starting at age 34 through age 12 or 74 with a history of 3 consecutive normal Pap tests.  HPV screening.** / Every 3 years from ages 46 through ages 43 to 54 with a history of 3 consecutive normal Pap tests.  Hepatitis C blood test.** / For any individual with known risks for hepatitis C.  Skin self-exam. / Monthly.  Influenza vaccine. / Every year.  Tetanus, diphtheria, and acellular pertussis (Tdap, Td) vaccine.** / Consult your health care provider. Pregnant women should receive 1 dose of Tdap vaccine during each pregnancy. 1 dose of Td every 10 years.  Varicella vaccine.** / Consult your health care provider. Pregnant females who do not have evidence of immunity should receive the first dose after pregnancy.  HPV vaccine. / 3 doses over 6 months, if 64 and younger. The vaccine is not recommended for use in  pregnant females. However, pregnancy testing is not needed before receiving a dose.  Measles, mumps, rubella (MMR) vaccine.** / You need at least 1 dose of MMR if you were born in 1957 or later. You may also need a 2nd dose. For females of childbearing age, rubella immunity should be determined. If there is no evidence of immunity, females who are not pregnant should be vaccinated. If there is no evidence of immunity, females who are pregnant should delay immunization until after pregnancy.  Pneumococcal 13-valent conjugate (PCV13) vaccine.** / Consult your health care provider.  Pneumococcal polysaccharide (PPSV23) vaccine.** / 1 to 2 doses if you smoke cigarettes or if you have certain conditions.  Meningococcal vaccine.** / 1 dose if you are age 71 to 36 years and a Market researcher living in a residence hall, or have one of several medical conditions, you need to get vaccinated against meningococcal disease. You may also need additional booster doses.  Hepatitis A vaccine.** / Consult your health care provider.  Hepatitis B vaccine.** / Consult your health care provider.  Haemophilus influenzae type b (Hib) vaccine.** / Consult your health care provider.  Ages 55 to 64years  Blood pressure check.** / Every 1 to 2 years.  Lipid and cholesterol check.** / Every 5 years beginning at age 20 years.  Lung cancer screening. / Every year if you are aged 55 80 years and have a 30-pack-year history of smoking and currently smoke or have quit within the past 15 years. Yearly screening is stopped once you have quit smoking for at least 15 years or develop a health problem that would prevent you from having lung cancer treatment.  Clinical breast exam.** / Every year after age 40 years.  BRCA-related cancer risk assessment.** / For women who have family members with a BRCA-related cancer (breast, ovarian, tubal, or peritoneal cancers).  Mammogram.** / Every year beginning at age 40  years and continuing for as long as you are in good health. Consult with your health care provider.  Pap test.** / Every 3 years starting at age 30 years through age 65 or 70 years with a history of 3 consecutive normal Pap tests.  HPV screening.** / Every 3 years from ages 30 years through ages 65 to 70 years with a history of 3 consecutive normal Pap tests.  Fecal occult blood test (FOBT) of stool. / Every year beginning at age 50 years and continuing until age 75 years. You may not need to do this test if you get a colonoscopy every 10 years.  Flexible sigmoidoscopy or colonoscopy.** / Every 5 years for a flexible sigmoidoscopy or every 10 years for a colonoscopy beginning at age 50 years and continuing until age 75 years.  Hepatitis C blood test.** / For all people born from 1945 through 1965 and any individual with known risks for hepatitis C.  Skin self-exam. / Monthly.  Influenza vaccine. / Every year.  Tetanus, diphtheria, and acellular pertussis (Tdap/Td) vaccine.** / Consult your health care provider. Pregnant women should receive 1 dose of Tdap vaccine during each pregnancy. 1 dose of Td every 10 years.  Varicella vaccine.** / Consult your health care provider. Pregnant females who do not have evidence of immunity should receive the first dose after pregnancy.  Zoster vaccine.** / 1 dose for adults aged 60 years or older.  Measles, mumps, rubella (MMR) vaccine.** / You need at least 1 dose of MMR if you were born in 1957 or later. You may also need a 2nd dose. For females of childbearing age, rubella immunity should be determined. If there is no evidence of immunity, females who are not pregnant should be vaccinated. If there is no evidence of immunity, females who are pregnant should delay immunization until after pregnancy.  Pneumococcal 13-valent conjugate (PCV13) vaccine.** / Consult your health care provider.  Pneumococcal polysaccharide (PPSV23) vaccine.** / 1 to 2 doses if  you smoke cigarettes or if you have certain conditions.  Meningococcal vaccine.** / Consult your health care provider.  Hepatitis A vaccine.** / Consult your health care provider.  Hepatitis B vaccine.** / Consult your health care provider.  Haemophilus influenzae type b (Hib) vaccine.** / Consult your health care provider.  Ages 65 years and over  Blood pressure check.** / Every 1 to 2 years.  Lipid and cholesterol check.** / Every 5 years beginning at age 20 years.  Lung cancer screening. / Every year if you are aged 55 80 years and have a 30-pack-year history of smoking and currently smoke or have quit within the past 15 years. Yearly screening is stopped once you have quit smoking for at least 15 years or develop a health problem that   would prevent you from having lung cancer treatment.  Clinical breast exam.** / Every year after age 103 years.  BRCA-related cancer risk assessment.** / For women who have family members with a BRCA-related cancer (breast, ovarian, tubal, or peritoneal cancers).  Mammogram.** / Every year beginning at age 36 years and continuing for as long as you are in good health. Consult with your health care provider.  Pap test.** / Every 3 years starting at age 5 years through age 85 or 10 years with 3 consecutive normal Pap tests. Testing can be stopped between 65 and 70 years with 3 consecutive normal Pap tests and no abnormal Pap or HPV tests in the past 10 years.  HPV screening.** / Every 3 years from ages 93 years through ages 70 or 45 years with a history of 3 consecutive normal Pap tests. Testing can be stopped between 65 and 70 years with 3 consecutive normal Pap tests and no abnormal Pap or HPV tests in the past 10 years.  Fecal occult blood test (FOBT) of stool. / Every year beginning at age 8 years and continuing until age 45 years. You may not need to do this test if you get a colonoscopy every 10 years.  Flexible sigmoidoscopy or colonoscopy.** /  Every 5 years for a flexible sigmoidoscopy or every 10 years for a colonoscopy beginning at age 69 years and continuing until age 68 years.  Hepatitis C blood test.** / For all people born from [birth year] through 1966 and any individual with known risks for hepatitis C.  Osteoporosis screening.** / A one-time screening for women ages 7 years and over and women at risk for fractures or osteoporosis.  Skin self-exam. / Monthly.  Influenza vaccine. / Every year.  Tetanus, diphtheria, and acellular pertussis (Tdap/Td) vaccine.** / 1 dose of Td every 10 years.  Varicella vaccine.** / Consult your health care provider.  Zoster vaccine.** / 1 dose for adults aged 5 years or older.  Pneumococcal 13-valent conjugate (PCV13) vaccine.** / Consult your health care provider.  Pneumococcal polysaccharide (PPSV23) vaccine.** / 1 dose for all adults aged 74 years and older.  Meningococcal vaccine.** / Consult your health care provider.  Hepatitis A vaccine.** / Consult your health care provider.  Hepatitis B vaccine.** / Consult your health care provider.  Haemophilus influenzae type b (Hib) vaccine.** / Consult your health care provider. ** Family history and personal history of risk and conditions may change your health care provider's recommendations. Document Released: 02/08/2002 Document Revised: 10/03/2013  [facility] Patient Information 2014 [place], [place].   EXERCISE AND DIET:  We recommended that you start or continue a regular exercise program for good health. Regular exercise means any activity that makes your heart beat faster and makes you sweat.  We recommend exercising at least 30 minutes per day at least 3 days a week, preferably 5.  We also recommend a diet low in fat and sugar / carbohydrates.  Inactivity, poor dietary choices and obesity can cause diabetes, heart attack, stroke, and kidney damage, among others.     ALCOHOL AND SMOKING:  Women should limit their alcohol intake to no  more than 7 drinks/beers/glasses of wine (combined, not each!) per week. Moderation of alcohol intake to this level decreases your risk of breast cancer and liver damage.  ( And of course, no recreational drugs are part of a healthy lifestyle.)  Also, you should not be smoking at all or even being exposed to second hand smoke. Most people know smoking can  cause cancer, and various heart and lung diseases, but did you know it also contributes to weakening of your bones?  Aging of your skin?  Yellowing of your teeth and nails?   CALCIUM AND VITAMIN D:  Adequate intake of calcium and Vitamin D are recommended.  The recommendations for exact amounts of these supplements seem to change often, but generally speaking 600 mg of calcium (either carbonate or citrate) and 800 units of Vitamin D per day seems prudent. Certain women may benefit from higher intake of Vitamin D.  If you are among these women, your doctor will have told you during your visit.     PAP SMEARS:  Pap smears, to check for cervical cancer or precancers,  have traditionally been done yearly, although recent scientific advances have shown that most women can have pap smears less often.  However, every woman still should have a physical exam from her gynecologist or primary care physician every year. It will include a breast check, inspection of the vulva and vagina to check for abnormal growths or skin changes, a visual exam of the cervix, and then an exam to evaluate the size and shape of the uterus and ovaries.  And after 49 years of age, a rectal exam is indicated to check for rectal cancers. We will also provide age appropriate advice regarding health maintenance, like when you should have certain vaccines, screening for sexually transmitted diseases, bone density testing, colonoscopy, mammograms, etc.    MAMMOGRAMS:  All women over 51 years old should have a yearly mammogram. Many facilities now offer a "3D" mammogram, which may cost  around $50 extra out of pocket. If possible,  we recommend you accept the option to have the 3D mammogram performed.  It both reduces the number of women who will be called back for extra views which then turn out to be normal, and it is better than the routine mammogram at detecting truly abnormal areas.     COLONOSCOPY:  Colonoscopy to screen for colon cancer is recommended for all women at age 21.  We know, you hate the idea of the prep.  We agree, BUT, having colon cancer and not knowing it is worse!!  Colon cancer so often starts as a polyp that can be seen and removed at colonscopy, which can quite literally save your life!  And if your first colonoscopy is normal and you have no family history of colon cancer, most women don't have to have it again for 10 years.  Once every ten years, you can do something that may end up saving your life, right?  We will be happy to help you get it scheduled when you are ready.  Be sure to check your insurance coverage so you understand how much it will cost.  It may be covered as a preventative service at no cost, but you should check your particular policy.    GREAT JOB on stopping alcohol use! Your blood pressure and weight are stable. We will call you when your lab results are available. Please follow-up with your OB/GYN soon. Increase regular exercise- walking, yoga. If diffuse stiffness/joint aches do not improve in a few months- please schedule a follow-up. Continue to social distance and wear a mask when in public. GREAT TO SEE YOU!

## 2019-07-03 NOTE — Assessment & Plan Note (Signed)
Stable, not requiring medication.

## 2019-07-03 NOTE — Assessment & Plan Note (Signed)
GREAT JOB on stopping alcohol use! Your blood pressure and weight are stable. We will call you when your lab results are available. Please follow-up with your OB/GYN soon. Increase regular exercise- walking, yoga. If diffuse stiffness/joint aches do not improve in a few months- please schedule a follow-up. Continue to social distance and wear a mask when in public.

## 2019-07-04 LAB — CBC WITH DIFFERENTIAL/PLATELET
Basophils Absolute: 0 10*3/uL (ref 0.0–0.2)
Basos: 1 %
EOS (ABSOLUTE): 0.1 10*3/uL (ref 0.0–0.4)
Eos: 1 %
Hematocrit: 41.1 % (ref 34.0–46.6)
Hemoglobin: 14.1 g/dL (ref 11.1–15.9)
Immature Grans (Abs): 0 10*3/uL (ref 0.0–0.1)
Immature Granulocytes: 0 %
Lymphocytes Absolute: 1.1 10*3/uL (ref 0.7–3.1)
Lymphs: 24 %
MCH: 32.3 pg (ref 26.6–33.0)
MCHC: 34.3 g/dL (ref 31.5–35.7)
MCV: 94 fL (ref 79–97)
Monocytes Absolute: 0.3 10*3/uL (ref 0.1–0.9)
Monocytes: 6 %
Neutrophils Absolute: 3.1 10*3/uL (ref 1.4–7.0)
Neutrophils: 68 %
Platelets: 317 10*3/uL (ref 150–450)
RBC: 4.37 x10E6/uL (ref 3.77–5.28)
RDW: 11.9 % (ref 11.7–15.4)
WBC: 4.6 10*3/uL (ref 3.4–10.8)

## 2019-07-04 LAB — LIPID PANEL
Chol/HDL Ratio: 3.1 ratio (ref 0.0–4.4)
Cholesterol, Total: 211 mg/dL — ABNORMAL HIGH (ref 100–199)
HDL: 68 mg/dL (ref >39)
LDL Calculated: 121 mg/dL — ABNORMAL HIGH (ref 0–99)
Triglycerides: 108 mg/dL (ref 0–149)
VLDL Cholesterol Cal: 22 mg/dL (ref 5–40)

## 2019-07-04 LAB — TSH: TSH: 1.74 u[IU]/mL (ref 0.450–4.500)

## 2019-07-04 LAB — COMPREHENSIVE METABOLIC PANEL
ALT: 7 IU/L (ref 0–32)
AST: 12 IU/L (ref 0–40)
Albumin/Globulin Ratio: 2 (ref 1.2–2.2)
Albumin: 5.1 g/dL — ABNORMAL HIGH (ref 3.8–4.8)
Alkaline Phosphatase: 59 IU/L (ref 39–117)
BUN/Creatinine Ratio: 9 (ref 9–23)
BUN: 6 mg/dL (ref 6–24)
Bilirubin Total: 0.6 mg/dL (ref 0.0–1.2)
CO2: 21 mmol/L (ref 20–29)
Calcium: 9.8 mg/dL (ref 8.7–10.2)
Chloride: 97 mmol/L (ref 96–106)
Creatinine, Ser: 0.7 mg/dL (ref 0.57–1.00)
GFR calc Af Amer: 118 mL/min/{1.73_m2} (ref >59)
GFR calc non Af Amer: 102 mL/min/{1.73_m2} (ref >59)
Globulin, Total: 2.5 g/dL (ref 1.5–4.5)
Glucose: 96 mg/dL (ref 65–99)
Potassium: 4.1 mmol/L (ref 3.5–5.2)
Sodium: 137 mmol/L (ref 134–144)
Total Protein: 7.6 g/dL (ref 6.0–8.5)

## 2019-07-04 LAB — HIV ANTIBODY (ROUTINE TESTING W REFLEX): HIV Screen 4th Generation wRfx: NONREACTIVE

## 2019-07-04 LAB — HEMOGLOBIN A1C
Est. average glucose Bld gHb Est-mCnc: 111 mg/dL
Hgb A1c MFr Bld: 5.5 % (ref 4.8–5.6)

## 2019-07-05 ENCOUNTER — Telehealth: Payer: Self-pay | Admitting: Adult Health

## 2019-07-05 NOTE — Telephone Encounter (Signed)
Patient called states she had some test done @ last OV & want to know if results are in.   ---Forwarding request to medical assistant to contact pt @ [phone].  -glh

## 2019-07-05 NOTE — Telephone Encounter (Signed)
Discussed results with pt via telephone.  Charyl Bigger, CMA

## 2019-07-19 NOTE — Progress Notes (Signed)
Received Epic notification that pt has not read MyChart message regarding results.     Recent lab results  From  Fonnie Mu, CMA To  Haedyn Breau Sent and Delivered  07/04/2019 10:40 AM  Ms. Boykin Reaper has reviewed your recent lab results and asked that I inform you that your complete blood count, metabolic panel including liver function tests, thyroid test and A1c (3 month average of blood sugars) were all normal. Also, your HIV test was negative.   Your HDL (good cholesterol) is 68 mg/dL (normal is greater than 40), your total cholesterol is above goal at 211 mg/dL (normal is less than 200), and your LDL (bad cholesterol) is above goal at 121 (normal is less than 100). However, your 10 year risk for a sudden heart attack or stroke is acceptable. Valetta Fuller recommends than you reduce the saturated fat in your diet to normalize these two levels.   Wishing you well,  Kenney Houseman, CMA for  Mina Marble, NP    Letter mailed to pt with results.  Charyl Bigger, CMA

## 2019-08-22 ENCOUNTER — Telehealth: Payer: Self-pay | Admitting: Adult Health

## 2019-08-22 NOTE — Telephone Encounter (Signed)
Patient called states she thinks has mild symptoms of COVID and just wanted to be referred for testing, advised Referral not needed & told her she could go directly to [facility] testing site to be tested.  ---Juluis Rainier to medical assistant.  --Dion Body

## 2019-11-21 NOTE — Progress Notes (Signed)
Virtual Visit via Telephone Note  I connected with Cindy [name] on 11/26/2019 at  4:15 PM EST by telephone and verified that I am speaking with the correct person using two identifiers.  Location: Patient: Home Provider: In Clinic   I discussed the limitations, risks, security and privacy concerns of performing an evaluation and management service by telephone and the availability of in person appointments. I also discussed with the patient that there may be a patient responsible charge related to this service. The patient expressed understanding and agreed to proceed.   History of Present Illness: Ms. Malsch calls in with worsening pain that initially began in bil hands and that has now radiated to both shoulders. L shoulder worse that R shoulder.  Hand pain began in July 2020. She describes dull ache to sharp pain- sharp pain rated 10/10. Pain is not associated with any time of day. Shoulder pain worsens with shoulder abduction. Hand pain worsens with house work, "when using her hand repetitively/ She is R hand dominant.  She has hx of tick bites- last exposure summer 2020. She denies bull eyes rash with tick bites. She does not believe that she has been tested for Lyme Disease in the past.  She denies fever/N/V/D. She denies change in appetite. She has been alternating OTC Acetaminophen and Ibuprofen. She has also tried heating pad- these measures have provided some sx relief. Father, sister, and brother have all experienced shoulder bursitis- all began around age 54. Again, she denies first degree hx of RA She denies acute injury/trauma prior to onset of sx's   07/03/2019 GFR 102  Patient Care Team    Relationship Specialty Notifications Start End  Mina Marble D, NP PCP - General Family Medicine  03/16/18   [facility] Ob[facility]Gyn    03/16/18   Mcarthur Rossetti, MD Consulting Physician Orthopedic Surgery  03/16/18     Patient Active Problem List   Diagnosis Date  Noted  . Anxiety 03/16/2018  . Healthcare maintenance 03/16/2018  . Foot mass, right 02/22/2018  . Pain in right ankle and joints of right foot 02/22/2018     Past Medical History:  Diagnosis Date  . Medical history non-contributory      Past Surgical History:  Procedure Laterality Date  . NO PAST SURGERIES       Family History  Problem Relation Age of Onset  . Cancer Father        brain  . Diabetes Brother   . Cancer Maternal Grandmother        lung  . Cancer Maternal Grandfather        brain and bone  . Cancer Paternal Grandfather        brain     Social History   Substance and Sexual Activity  Drug Use No     Social History   Substance and Sexual Activity  Alcohol Use Yes  . Alcohol/week: 6.0 standard drinks  . Types: 6 Cans of beer per week   Comment: few times a week     Social History   Tobacco Use  Smoking Status Never Smoker  Smokeless Tobacco Never Used     No outpatient encounter medications on file as of 11/26/2019.   No facility-administered encounter medications on file as of 11/26/2019.     Allergies: Latex  There is no height or weight on file to calculate BMI.  unknown if currently breastfeeding. Review of Systems: General:   Denies fever, chills, unexplained weight loss.  Optho/Auditory:  Denies visual changes, blurred vision/LOV Respiratory:   Denies SOB, DOE more than baseline levels.  Cardiovascular:   Denies chest pain, palpitations, new onset peripheral edema  Gastrointestinal:   Denies nausea, vomiting, diarrhea.  Genitourinary: Denies dysuria, freq/ urgency, flank pain or discharge from genitals.  Endocrine:     Denies hot or cold intolerance, polyuria, polydipsia. Musculoskeletal:    Myalgias +, joint swelling, unexplained arthralgias, gait problems.  Skin:  Denies rash, suspicious lesions Neurological:     Denies dizziness, unexplained weakness, numbness  Psychiatric/Behavioral:   Denies mood changes,  suicidal or homicidal ideations, hallucinations  Observations/Objective: No acute distress noted during telephone conversation.  Assessment and Plan: Meloxicam 7.5mg  QD- do not take other NSAIDs when taking this Continue heat PRN Lyme and RA panels ordered- if negative then recommend f/u with Orthopedic specialist.  Follow Up Instructions: PRN   I discussed the assessment and treatment plan with the patient. The patient was provided an opportunity to ask questions and all were answered. The patient agreed with the plan and demonstrated an understanding of the instructions.   The patient was advised to call back or seek an in-person evaluation if the symptoms worsen or if the condition fails to improve as anticipated.  I provided 18  minutes of non-face-to-face time during this encounter.   Esaw Grandchild, NP

## 2019-11-26 ENCOUNTER — Encounter: Payer: Self-pay | Admitting: Adult Health

## 2019-11-26 ENCOUNTER — Ambulatory Visit (INDEPENDENT_AMBULATORY_CARE_PROVIDER_SITE_OTHER): Payer: Managed Care, Other (non HMO) | Admitting: Adult Health

## 2019-11-26 ENCOUNTER — Other Ambulatory Visit: Payer: Self-pay

## 2019-11-26 VITALS — Temp 98.3°F

## 2019-11-26 DIAGNOSIS — M25511 Pain in right shoulder: Secondary | ICD-10-CM | POA: Diagnosis not present

## 2019-11-26 DIAGNOSIS — G8929 Other chronic pain: Secondary | ICD-10-CM

## 2019-11-26 DIAGNOSIS — M25512 Pain in left shoulder: Secondary | ICD-10-CM

## 2019-11-26 DIAGNOSIS — M79641 Pain in right hand: Secondary | ICD-10-CM | POA: Diagnosis not present

## 2019-11-26 DIAGNOSIS — M79642 Pain in left hand: Secondary | ICD-10-CM | POA: Insufficient documentation

## 2019-11-26 DIAGNOSIS — R52 Pain, unspecified: Secondary | ICD-10-CM | POA: Diagnosis not present

## 2019-11-26 MED ORDER — MELOXICAM 7.5 MG PO TABS: 7.5000 mg | ORAL_TABLET | Freq: Every day | ORAL | 0 refills | Status: DC

## 2019-11-26 NOTE — Assessment & Plan Note (Signed)
Assessment and Plan: Meloxicam 7.5mg  QD- do not take other NSAIDs when taking this Continue heat PRN Lyme and RA panels ordered- if negative then recommend f/u with Orthopedic specialist.  Follow Up Instructions: PRN   I discussed the assessment and treatment plan with the patient. The patient was provided an opportunity to ask questions and all were answered. The patient agreed with the plan and demonstrated an understanding of the instructions.   The patient was advised to call back or seek an in-person evaluation if the symptoms worsen or if the condition fails to improve as anticipated.

## 2019-11-27 ENCOUNTER — Telehealth: Payer: Self-pay | Admitting: Adult Health

## 2019-11-27 NOTE — Telephone Encounter (Signed)
Would recommend that reduce caffeine intake by at least 50%. Would recommend that she take meloxicam with lunch- spaced away from caffeine use. Thanks! Cindy [name]

## 2019-11-27 NOTE — Telephone Encounter (Signed)
Please advise.  T. Nelson, CMA 

## 2019-11-27 NOTE — Telephone Encounter (Signed)
VM box is full and cannot accept messages.  Charyl Bigger, CMA

## 2019-11-27 NOTE — Telephone Encounter (Signed)
Patient was put on meloxicam and called this morning stating she is a big coffee drinker (roughly 5 cups/day) and wants to make sure that this caffeine intake is ok with this med. Please advise

## 2019-11-28 NOTE — Telephone Encounter (Signed)
VM box is full and cannot accept messages.  MyChart message sent to pt.  Charyl Bigger, CMA

## 2019-11-29 ENCOUNTER — Other Ambulatory Visit: Payer: Self-pay

## 2019-11-29 ENCOUNTER — Other Ambulatory Visit: Payer: Managed Care, Other (non HMO)

## 2019-11-30 LAB — LYME AB/WESTERN BLOT REFLEX
LYME DISEASE AB, QUANT, IGM: <0.8 index (ref 0.00–0.79)
Lyme IgG/IgM Ab: <0.91 {ISR} (ref 0.00–0.90)

## 2019-11-30 LAB — RHEUMATOID FACTOR: Rhuematoid fact SerPl-aCnc: <10 IU/mL (ref 0.0–13.9)

## 2020-03-06 ENCOUNTER — Other Ambulatory Visit: Payer: Self-pay

## 2020-03-06 ENCOUNTER — Encounter: Payer: Self-pay | Admitting: Family Medicine

## 2020-03-06 ENCOUNTER — Ambulatory Visit (INDEPENDENT_AMBULATORY_CARE_PROVIDER_SITE_OTHER): Payer: Managed Care, Other (non HMO) | Admitting: Family Medicine

## 2020-03-06 VITALS — BP 152/88 | HR 85 | Temp 99.0°F | Ht 62.75 in | Wt 157.1 lb

## 2020-03-06 DIAGNOSIS — M7022 Olecranon bursitis, left elbow: Secondary | ICD-10-CM | POA: Diagnosis not present

## 2020-03-06 DIAGNOSIS — M19019 Primary osteoarthritis, unspecified shoulder: Secondary | ICD-10-CM

## 2020-03-06 DIAGNOSIS — M2011 Hallux valgus (acquired), right foot: Secondary | ICD-10-CM | POA: Diagnosis not present

## 2020-03-06 NOTE — Patient Instructions (Addendum)
Bunion  A bunion is a bump on the base of the big toe that forms when the bones of the big toe joint move out of position. Bunions may be small at first, but they often get larger over time. They can make walking painful. What are the causes? A bunion may be caused by:  Wearing narrow or pointed shoes that force the big toe to press against the other toes.  Abnormal foot development that causes the foot to roll inward (pronate).  Changes in the foot that are caused by certain diseases, such as rheumatoid arthritis or polio.  A foot injury. What increases the risk? The following factors may make you more likely to develop this condition:  Wearing shoes that squeeze the toes together.  Having certain diseases, such as: ? Rheumatoid arthritis. ? Polio. ? Cerebral palsy.  Having family members who have bunions.  Being born with a foot deformity, such as flat feet or low arches.  Doing activities that put a lot of pressure on the feet, such as ballet dancing. What are the signs or symptoms? The main symptom of a bunion is a noticeable bump on the big toe. Other symptoms may include:  Pain.  Swelling around the big toe.  Redness and inflammation.  Thick or hardened skin on the big toe or between the toes.  Stiffness or loss of motion in the big toe.  Trouble with walking. How is this diagnosed? A bunion may be diagnosed based on your symptoms, medical history, and activities. You may have tests, such as:  X-rays. These allow your health care provider to check the position of the bones in your foot and look for damage to your joint. They also help your health care provider determine the severity of your bunion and the best way to treat it.  Joint aspiration. In this test, a sample of fluid is removed from the toe joint. This test may be done if you are in a lot of pain. It helps rule out diseases that cause painful swelling of the joints, such as arthritis. How is  this treated? Treatment depends on the severity of your symptoms. The goal of treatment is to relieve symptoms and prevent the bunion from getting worse. Your health care provider may recommend:  Wearing shoes that have a wide toe box.  Using bunion pads to cushion the affected area.  Taping your toes together to keep them in a normal position.  Placing a device inside your shoe (orthotics) to help reduce pressure on your toe joint.  Taking medicine to ease pain, inflammation, and swelling.  Applying heat or ice to the affected area.  Doing stretching exercises.  Surgery to remove scar tissue and move the toes back into their normal position. This treatment is rare. Follow these instructions at home: Managing pain, stiffness, and swelling   If directed, put ice on the painful area: ? Put ice in a plastic bag. ? Place a towel between your skin and the bag. ? Leave the ice on for 20 minutes, 2-3 times a day. Activity   If directed, apply heat to the affected area before you exercise. Use the heat source that your health care provider recommends, such as a moist heat pack or a heating pad. ? Place a towel between your skin and the heat source. ? Leave the heat on for 20-30 minutes. ? Remove the heat if your skin turns bright red. This is especially important if you are unable  to feel pain, heat, or cold. You may have a greater risk of getting burned.  Do exercises as told by your health care provider. General instructions  Support your toe joint with proper footwear, shoe padding, or taping as told by your health care provider.  Take over-the-counter and prescription medicines only as told by your health care provider.  Keep all follow-up visits as told by your health care provider. This is important. Contact a health care provider if your symptoms:  Get worse.  Do not improve in 2 weeks. Get help right away if you have:  Severe pain and trouble with  walking. Summary  A bunion is a bump on the base of the big toe that forms when the bones of the big toe joint move out of position.  Bunions can make walking painful.  Treatment depends on the severity of your symptoms.  Support your toe joint with proper footwear, shoe padding, or taping as told by your health care provider. This information is not intended to replace advice given to you by your health care provider. Make sure you discuss any questions you have with your health care provider. Document Revised: 06/19/2018 Document Reviewed: 04/25/2018 Elsevier Patient Education  [place].      Elbow Bursitis  Bursitis is swelling and pain at the tip of the elbow. This happens when fluid builds up in a sac under the skin (bursa). This may also be called olecranon bursitis. What are the causes? Elbow bursitis may be caused by:  Elbow injury, such as falling onto the elbow.  Leaning on hard surfaces for long periods of time.  Infection from an injury that breaks the skin near the elbow.  A bone growth (spur) that forms at the tip of the elbow. Sometimes the cause is not known. What are the signs or symptoms? The first sign of elbow bursitis is usually swelling at the tip of the elbow. This can grow to be about the size of a golf ball. Swelling may start suddenly or develop gradually. Other symptoms may include:  Pain when bending or leaning on the elbow.  Not being able to move the elbow normally. If bursitis is caused by an infection, you may have:  Redness, warmth, and tenderness of the elbow.  Drainage of pus from the swollen area over the elbow, if the skin breaks open. How is this diagnosed? This condition may be diagnosed based on:  Your symptoms and medical history.  Any recent injuries you have had.  A physical exam.  Draining fluid from the bursa to test it for infection if indicated ( red, hot, swollen acutely).  Blood tests to rule out gout or  rheumatoid arthritis. How is this treated? Treatment for elbow bursitis depends on the cause. Treatment may include:  Medicines. These may include: ? Over-the-counter medicines to relieve pain and inflammation. ? Antibiotic medicines. ? Injections of anti-inflammatory medicines (steroids).  Draining fluid from the bursa.  Wrapping your elbow with a bandage.  Wearing elbow pads. If these treatments do not help, you may need surgery to remove the bursa. Follow these instructions at home: Medicines  Take over-the-counter and prescription medicines only as told by your health care provider.  If you were prescribed an antibiotic medicine, take it as told by your health care provider. Do not stop taking the antibiotic even if you start to feel better. Managing pain, stiffness, and swelling   If directed, put ice on your elbow: ? Put ice in  a plastic bag. ? Place a towel between your skin and the bag. ? Leave the ice on for 20 minutes, 2-3 times a day.  If your bursitis is caused by an injury, rest your elbow and wear your bandage as told by your health care provider.  Use elbow pads or elbow wraps to cushion your elbow as needed. General instructions  Avoid any activities that cause elbow pain. Ask your health care provider what activities are safe for you.  Keep all follow-up visits as told by your health care provider. This is important. Contact a health care provider if you have:  A fever.  Symptoms that do not get better with treatment.  Pain or swelling that: ? Gets worse. ? Goes away and then comes back.  Pus draining from your elbow. Get help right away if you have:  Trouble moving your arm, hand, or fingers. Summary  Elbow bursitis is inflammation of the fluid-filled sac (bursa) between the tip of your elbow bone (olecranon) and your skin.  Treatment for elbow bursitis depends on the cause. It may include medicines to relieve pain and inflammation, antibiotic  medicines, and draining fluid from your elbow.  Contact a health care provider if your symptoms do not get better with treatment, or if your symptoms go away and then come back. This information is not intended to replace advice given to you by your health care provider. Make sure you discuss any questions you have with your health care provider. Document Revised: 11/25/2017 Document Reviewed: 11/22/2017 Elsevier Patient Education  2020 [employer].

## 2020-03-06 NOTE — Progress Notes (Signed)
Of note, this is my first time meeting patient.  Patient is new to me and was previously being cared for at our office by Mina Marble, NP, who no longer works at primary care [employer].   Impression and Recommendations:    1. Olecranon bursitis, left elbow   2. Hallux valgus (acquired), right foot   3. Osteoarthritis of shoulder, unspecified laterality, unspecified osteoarthritis type:  L>er R     - Provider Mina Marble obtained labs to rule out RA and lyme disease prior.  Olecranon Bursitis, Left Elbow - Discussed patient's symptoms extensively during appointment today. -Advised that the accumulation of the bursa feels very viscous and not very fluctuant.  This indicates a chronic process and not an acute 1 which would be more amenable to drainage. -Discussed risk benefit of procedure and possibly it being too thick to draw out, patient will try conservative treatment and if worsens, will let us know, as we can always drain it - Advised patient of possible etiologies for her condition.  Definitely due to chronic leaning on her elbow which patient states she does daily while reading or working on [employer]. -Advised against this being gout or infection etc in her right first MTP joint or her elbow as this does not come on acutely and is not red hot swollen nor is it painful. - Extensive education provided to patient and all questions answered.   - Discussed that patient's symptoms are most likely from chronic repeated pressure on her elbow as she leans on it much of the day - Told patient to discontinue all repetitive trauma to the area causing symptoms.  - To help decrease inflammation, advised patient to ice the area of symptoms for 15 minutes at a time, 3-6 times per day.  - Discussed that NSAIDS such as Mobic/meloxicam can help to alleviate pain, but cautioned against regular use - Advised patient to avoid overuse of Advil to help protect gastric health and kidney  health.  - Patient knows to call in with any further concerns.  - Will continue to monitor.   Hallux Valgus (acquired), R Foot - Reviewed patient's concerns and symptoms during appointment today. -She had seen Dr. Ninfa Linden for her right hallux valgus in the past.  MRI was obtained and he told her per patient that nothing else could be done. - Education provided and all questions answered. - For patient's foot concerns, advised referral to podiatry or [facility] if desired. - Patient prefers to check at her current orthopedics office Dr. Trevor Mace to see if they have a foot specialist first.  If they do not, she will call and let us know if she desires a referral to a podiatrist, which is what I recommend for next steps in treatment.   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  - Encouraged patient to modify her workspace to help reduce activities that might contribute to symptoms.  Ergonomics briefly discussed and she will need to have frequent changes in position with different angles being placed on her joints.  - To help alleviate pain, advised patient to ice the area of concern as discussed prior.  -We discussed that physical therapy would be a good option for her  however, since she is already established with orthopedics, Dr. Ninfa Linden, we discussed that they can do a joint injection since her pain is not well controlled on the meloxicam previously given.  She will ask them about physical therapy.  - Will continue to monitor.   Health Counseling & Preventative Maintenance - Advised patient to continue working toward exercising to improve overall mental, physical, and emotional health.    - Reviewed the "spokes of the wheel" of mood  and overall health management.  Stressed the importance of ongoing prudent habits, including regular exercise, appropriate sleep hygiene, healthful dietary habits, and prayer/meditation to relax.  Encouraged patient to use a meditation app for stress relief.  - Encouraged patient to engage in daily physical activity as tolerated, especially a formal exercise routine.  Recommended that the patient eventually strive for at least 150 minutes of moderate cardiovascular activity per week according to guidelines established by the [facility].   - Health counseling performed.  All questions answered.   Medications Discontinued During This Encounter  Medication Reason  . meloxicam (MOBIC) 7.5 MG tablet No longer needed (for PRN medications)     Gross side effects, risk and benefits, and alternatives of medications and treatment plan in general discussed with patient.  Patient is aware that all medications have potential side effects and we are unable to predict every side effect or drug-drug interaction that may occur.   Patient will call with any questions prior to using medication if they have concerns.    Expresses verbal understanding and consents to current therapy and treatment regimen.  No barriers to understanding were identified.  Red flag symptoms and signs discussed in detail.  Patient expressed understanding regarding what to do in case of emergency\urgent symptoms  Please see AVS handed out to patient at the end of our visit for further patient instructions/ counseling done pertaining to today's office visit.   Return for yearly CPE and full fasting blood work same day around 07/02/2020.     Note:  This note was prepared with assistance of Dragon voice recognition software. Occasional wrong-word or sound-a-like substitutions may have occurred due to the inherent limitations of voice recognition software.   The [place] was signed into law in 2016 which includes the topic of  electronic health records.  This provides immediate access to information in MyChart.  This includes consultation notes, operative notes, office notes, lab results and pathology reports.  If you have any questions about what you read please let us know at your next visit or call us at the office.  We are right here with you.   This case required medical decision making of at least moderate complexity.  This document serves as a record of services personally performed by Mellody Dance, DO. It was created on her behalf by Toni Amend, a trained medical scribe. The creation of this record is based on the scribe's personal observations and the provider's statements to them.   This case required medical decision making of at least moderate complexity. The above documentation from Toni Amend, medical scribe, has been reviewed by Marjory Sneddon, D.O.       --------------------------------------------------------------------------------------------------------------------------------------------------------------------------------------------------------------------------------------------    Subjective:     Cindy [name], am serving as scribe for Dr.Karyna Bessler.   HPI: Cindy [name] is a 49 y.o. female who presents to [place] at [facility] today for issues as discussed below.  Notes she possibly has a family history of gout in her grandparents.  Denies personal history of gout; not that I'm aware of.  She takes advil 2-3 days per week, notes "typically tries to wait until nighttime to take it, and I make sure I always eat if I'm going to take it, and I don't take it any more than I have to."  - Left Elbow Symptoms Patient first noted her symptoms about two days ago.  Notes she's been having a lot of joint problems, which she's seen Valetta Fuller for in the past.  Her shoulders have limited ROM and she notes "a lot of pain."  Notes Valetta Fuller ran a lyme panel and an  RA panel in the past, which were both negative.  Was started on meloxicam "but it didn't seem to do much to tell you the truth."  When the panels came back negative, she was told to go see a specialist such as an orthopedist.  She has followed up with Dr. Ninfa Linden in the past.  Patient is right handed and notes she reads a lot, and says she leans on her right elbow with her head propped up with her left hand while reading for many hours per day.  It was just 2 to 3 days ago that she first noticed symptoms in her elbow.  She has been doing more sitting around the house and clerical work indoors since [employer] started.  This has been going on many months  She is not in an occupation where her arm is repeatedly banging against anything.  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  Additional Hand and shoulder concerns- Of note she also complains of her hands being stiff and having pain at times.  She does a lot of computer work all day for work.    She states that when she first gets up in the AM and gets moving, her shoulders and hands do improve as her body "warms up".  She also states her muscles of her upper back are stiff a lot and do also improve with movement.   - Foot Concerns She had an injury to her foot a year and half ago and thinks she sprained her toe; notes "it affected my whole foot and I'm having problems with it to this day."    She saw an orthopedist Dr. Ninfa Linden and nd had an MRI done and was told there was nothing structurally he could do, "but that it might be the beginning of gout or arthritis or something."  Notes she walks at least 10 minutes with her dog 2-3 times every day, but by the time she gets finished with her walk, her foot hurts  badly.       Wt Readings from Last 3 Encounters:  03/06/20 157 lb 1.6 oz (71.3 kg)  07/03/19 148 lb 14.4 oz (67.5 kg)  03/16/18 142 lb 12.8 oz (64.8 kg)   BP Readings from Last 3 Encounters:  03/06/20 (!) 152/88  07/03/19 127/84  03/16/18 (!) 157/92   Pulse Readings from Last 3 Encounters:  03/06/20 85  07/03/19 80  03/16/18 92   BMI Readings from Last 3 Encounters:  03/06/20 28.05 kg/m  07/03/19 26.59 kg/m  03/16/18 25.50 kg/m     Patient Care Team  Relationship Specialty Notifications Start End  Mina Marble D, NP PCP - General Family Medicine  03/16/18   [facility] Ob[facility]Gyn    03/16/18   Mcarthur Rossetti, MD Consulting Physician Orthopedic Surgery  03/16/18      Patient Active Problem List   Diagnosis Date Noted  . Hallux valgus (acquired), right foot 03/06/2020  . Olecranon bursitis, left elbow 03/06/2020  . 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  . Bilateral hand pain 11/26/2019  . Bilateral shoulder pain 11/26/2019  . Anxiety 03/16/2018  . Healthcare maintenance 03/16/2018  . Foot mass, right 02/22/2018  . Pain in right ankle and joints of right foot 02/22/2018    Past Medical history, Surgical history, Family history, Social history, Allergies and Medications have been entered into the medical record, reviewed and changed as needed.    No outpatient medications have been marked as taking for the 03/06/20 encounter (Office Visit) with Mellody Dance, DO.    Allergies:  Allergies  Allergen Reactions  . Latex Swelling and Rash     Review of Systems:  A fourteen system review of systems was performed and found to be positive as per HPI.   Objective:   Blood pressure (!) 152/88, pulse 85, temperature 99 F (37.2 C), temperature source Oral, height 5' 2.75" (1.594 m), weight 157 lb 1.6 oz (71.3 kg), last menstrual period 02/11/2020, SpO2 100 %. Body mass index is 28.05 kg/m. General:  Well Developed, well nourished,  appropriate for stated age.  Neuro:  Alert and oriented,  extra-ocular muscles intact  HEENT:  Normocephalic, atraumatic, neck supple, no carotid bruits appreciated  Skin:  no gross rash, warm, pink. Cardiac:  RRR, S1 S2 Respiratory:  ECTA B/L and A/P, Not using accessory muscles, speaking in full sentences- unlabored. Vascular:  Ext warm, no cyanosis apprec.; cap RF less 2 sec. Psych:  No HI/SI, judgement and insight good, Euthymic mood. Full Affect. Left Elbow:  left elbow over the olecranon process is inflamed with a viscous fluctuance present.  Small to medium-sized viscous bursal fluid accumulation , with no erythema, no increased heat, or pain on movement.  No bony tenderness.  Of note she does have bilateral bony prominences of her olecranon Right Foot:  slight bony protrusion of the right first MTP joint consistent with hallux valgus.  No swelling, erythema or tenderness to palpation.

## 2020-10-13 ENCOUNTER — Ambulatory Visit (INDEPENDENT_AMBULATORY_CARE_PROVIDER_SITE_OTHER): Payer: Managed Care, Other (non HMO)

## 2020-10-13 ENCOUNTER — Other Ambulatory Visit: Payer: Self-pay

## 2020-10-13 DIAGNOSIS — Z23 Encounter for immunization: Secondary | ICD-10-CM | POA: Diagnosis not present

## 2020-12-27 DIAGNOSIS — C50919 Malignant neoplasm of unspecified site of unspecified female breast: Secondary | ICD-10-CM

## 2020-12-27 HISTORY — DX: Malignant neoplasm of unspecified site of unspecified female breast: C50.919

## 2020-12-30 ENCOUNTER — Other Ambulatory Visit: Payer: Self-pay | Admitting: Obstetrics and Gynecology

## 2020-12-30 DIAGNOSIS — R928 Other abnormal and inconclusive findings on diagnostic imaging of breast: Secondary | ICD-10-CM

## 2020-12-31 ENCOUNTER — Other Ambulatory Visit: Payer: Self-pay | Admitting: Physician Assistant

## 2021-01-01 ENCOUNTER — Ambulatory Visit
Admission: RE | Admit: 2021-01-01 | Discharge: 2021-01-01 | Disposition: A | Discharge status: Home / Self Care | Payer: Managed Care, Other (non HMO) | Source: Ambulatory Visit | Attending: Obstetrics and Gynecology | Admitting: Obstetrics and Gynecology

## 2021-01-01 ENCOUNTER — Other Ambulatory Visit: Payer: Self-pay | Admitting: Obstetrics and Gynecology

## 2021-01-01 ENCOUNTER — Other Ambulatory Visit: Payer: Self-pay

## 2021-01-01 DIAGNOSIS — R928 Other abnormal and inconclusive findings on diagnostic imaging of breast: Secondary | ICD-10-CM

## 2021-01-02 ENCOUNTER — Other Ambulatory Visit: Payer: Managed Care, Other (non HMO)

## 2021-01-16 ENCOUNTER — Other Ambulatory Visit: Payer: Self-pay | Admitting: General Surgery

## 2021-01-16 ENCOUNTER — Encounter: Payer: Self-pay | Admitting: *Deleted

## 2021-01-16 DIAGNOSIS — Z170 Estrogen receptor positive status [ER+]: Secondary | ICD-10-CM

## 2021-01-16 DIAGNOSIS — C50412 Malignant neoplasm of upper-outer quadrant of left female breast: Secondary | ICD-10-CM | POA: Insufficient documentation

## 2021-01-19 ENCOUNTER — Encounter: Payer: Self-pay | Admitting: *Deleted

## 2021-01-19 ENCOUNTER — Telehealth: Payer: Self-pay | Admitting: *Deleted

## 2021-01-19 NOTE — Telephone Encounter (Signed)
RETURNED PATIENT'S PHONE CALL, SPOKE WITH PATIENT. ?

## 2021-01-19 NOTE — Progress Notes (Signed)
[place]   [phone] Fax:([phone]) [place] Note   Patient Care Team: Lorrene Reid, PA-C as PCP - [facility], [facility] Mcarthur Rossetti, MD as Consulting Physician (Orthopedic Surgery) Mauro Kaufmann, RN as Oncology Nurse Navigator Rockwell Germany, RN as Oncology Nurse Navigator  Date of Service:  01/21/2021   CHIEF COMPLAINTS/PURPOSE OF CONSULTATION:  Newly Diagnosed Malignant neoplasm of upper-outer quadrant of left breast  REFERRING PHYSICIAN:  Dr Donne Hazel   Oncology History Overview Note  Cancer Staging Malignant neoplasm of upper-outer quadrant of left breast in female, estrogen receptor positive ([place]) Staging form: Breast, AJCC 8th Edition - Clinical stage from 01/01/2021: Stage IA (cT1c, cN0, cM0, G2, ER+, PR+, HER2-) - Signed by Truitt Merle, MD on 01/20/2021    Malignant neoplasm of upper-outer quadrant of left breast in female, estrogen receptor positive ([place])  01/01/2021 Mammogram   Mammogram 01/01/21  IMPRESSION: 1. 11x7x8 mm mass in the 1 o'clock position of the left breast 8cmfn, highly suspicious for breast malignancy. No left axillary lymphadenopathy.   01/01/2021 Initial Biopsy   Diagnosis 01/01/21 Breast, left, needle core biopsy, 1 o'clock, upper outer quadrant, ribbon clip - INVASIVE MAMMARY CARCINOMA, GRADE II. - SEE MICROSCOPIC DESCRIPTION. Microscopic Comment The greatest tumor length is 0.9 cm. E-cadherin and breast prognostic profile will be performed. 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    01/01/2021 Receptors her2   PROGNOSTIC INDICATORS Results: IMMUNOHISTOCHEMICAL AND MORPHOMETRIC ANALYSIS PERFORMED MANUALLY The tumor cells are EQUIVOCAL for Her2 (2+). HER2 by FISH will be performed and the results reported separately Estrogen Receptor: 95%, POSITIVE, STRONG STAINING INTENSITY Progesterone Receptor: 50%,  POSITIVE, MODERATE STAINING INTENSITY Proliferation Marker Ki67: 20% FLUORESCENCE IN-SITU HYBRIDIZATION Results: GROUP 5: HER2 **NEGATIVE** Equivocal form of amplification of the HER2 gene was detected in the IHC 2+ tissue sample received from this individual. HER2 FISH was performed by a technologist and cell imaging and analysis on the [employer].   01/01/2021 Cancer Staging   Staging form: Breast, AJCC 8th Edition - Clinical stage from 01/01/2021: Stage IA (cT1c, cN0, cM0, G2, ER+, PR+, HER2-) - Signed by Truitt Merle, MD on 01/20/2021   01/16/2021 Initial Diagnosis   Malignant neoplasm of upper-outer quadrant of left breast in female, estrogen receptor positive ([place])      HISTORY OF PRESENTING ILLNESS:  Cindy [name] 50 y.o. female is a here because of newly diagnosed left breast cancer. The patient was referred by Dr Donne Hazel. The patient presents to the clinic today accompanied by her husband.  She notes she first felt her left breast lump almost 1 months ago and was seen for mammogram which showed left breast mass. Biopsy showed this to be cancer. She has had mammograms yearly, but her last was in 2018. She notes some pain at her left breast including dull aches and shooting pain. She denies weight loss and any other breast changes lately. She denies breathing or abdominal issues lately.   Socially she is married with 1 son. She stopped drinking in 2020. She does not smokes. She works at home.  She has a PMHx of anxiety. She is not on medication for this. She otherwise has no significant past medical history. She notes her periods have been more irregular and farther apart in the past year.  Her father had brain cancer and MGM had lung cancer from smoking.     GYN HISTORY  Menarchal: 12 LMP: irregular, last in 2021  Contraceptive: NA G2P1A1: 1 Miscarriage, first at age 27, breastfeeding   REVIEW OF SYSTEMS:    Constitutional: Denies fevers, chills or abnormal night sweats Eyes: Denies  blurriness of vision, double vision or watery eyes Ears, nose, mouth, throat, and face: Denies mucositis or sore throat Respiratory: Denies cough, dyspnea or wheezes Cardiovascular: Denies palpitation, chest discomfort or lower extremity swelling Gastrointestinal:  Denies nausea, heartburn or change in bowel habits Skin: Denies abnormal skin rashes Lymphatics: Denies new lymphadenopathy or easy bruising Neurological:Denies numbness, tingling or new weaknesses Behavioral/Psych: Mood is stable, no new changes  All other systems were reviewed with the patient and are negative.   MEDICAL HISTORY:  Past Medical History:  Diagnosis Date  . Anxiety   . Medical history non-contributory     SURGICAL HISTORY: Past Surgical History:  Procedure Laterality Date  . EXCISION OF BREAST BIOPSY    . NO PAST SURGERIES      SOCIAL HISTORY: Social History   Socioeconomic History  . Marital status: Married    Spouse name: Not on file  . Number of children: 1  . Years of education: Not on file  . Highest education level: Not on file  Occupational History  . Occupation: home maker   Tobacco Use  . Smoking status: Never Smoker  . Smokeless tobacco: Never Used  Vaping Use  . Vaping Use: Never used  Substance and Sexual Activity  . Alcohol use: Yes    Alcohol/week: 6.0 standard drinks    Types: 6 Cans of beer per week    Comment: few times a week, stopped in 2020  . Drug use: No  . Sexual activity: Yes    Birth control/protection: None  Other Topics Concern  . Not on file  Social History Narrative  . Not on file   Social Determinants of Health   Financial Resource Strain: Not on file  Food Insecurity: Not on file  Transportation Needs: Not on file  Physical Activity: Not on file  Stress: Not on file  Social Connections: Not on file  Intimate Partner Violence: Not on file    FAMILY HISTORY: Family History  Problem Relation Age of Onset  . Cancer Father        brain  .  Diabetes Brother   . Cancer Maternal Grandmother        lung  . Cancer Maternal Grandfather        brain and bone  . Cancer Paternal Grandfather        brain    ALLERGIES:  is allergic to latex.  MEDICATIONS:  No current outpatient medications on file.   No current facility-administered medications for this visit.    PHYSICAL EXAMINATION: ECOG PERFORMANCE STATUS: 0 - Asymptomatic  Vitals:   01/21/21 1507  BP: (!) 144/82  Pulse: 77  Resp: 14  Temp: 97.7 F (36.5 C)  SpO2: 100%   Filed Weights   01/21/21 1507  Weight: 153 lb 12.8 oz (69.8 kg)    GENERAL:alert, no distress and comfortable SKIN: skin color, texture, turgor are normal, no rashes or significant lesions EYES: normal, Conjunctiva are pink and non-injected, sclera clear  NECK: supple, thyroid normal size, non-tender, without nodularity LYMPH:  no palpable lymphadenopathy in the cervical, axillary  LUNGS: clear to auscultation and percussion with normal breathing effort HEART: regular rate & rhythm and no murmurs and no lower extremity edema ABDOMEN:abdomen soft, non-tender and normal bowel sounds Musculoskeletal:no cyanosis of digits and no clubbing  NEURO: alert & oriented  x 3 with fluent speech, no focal motor/sensory deficits BREAST: (+) Mild skin ecchymosis at left breast biopsy site (+) Palpable 1cm mass, movable in left breast. Right breast exam benign.  LABORATORY DATA:  I have reviewed the data as listed CBC Latest Ref Rng & Units 07/03/2019 12/28/2013 12/26/2013  WBC 3.4 - 10.8 x10E3/uL 4.6 8.2 9.3  Hemoglobin 11.1 - 15.9 g/dL 14.1 13.0 12.3  Hematocrit 34.0 - 46.6 % 41.1 38.6 36.0  Platelets 150 - 450 x10E3/uL 317 212 217    CMP Latest Ref Rng & Units 07/03/2019 11/29/2013  Glucose 65 - 99 mg/dL 96 111(H)  BUN 6 - 24 mg/dL 6 16  Creatinine 0.57 - 1.00 mg/dL 0.70 0.78  Sodium 134 - 144 mmol/L 137 138  Potassium 3.5 - 5.2 mmol/L 4.1 4.5  Chloride 96 - 106 mmol/L 97 100  CO2 20 - 29 mmol/L 21 22   Calcium 8.7 - 10.2 mg/dL 9.8 9.6  Total Protein 6.0 - 8.5 g/dL 7.6 8.1  Total Bilirubin 0.0 - 1.2 mg/dL 0.6 1.2  Alkaline Phos 39 - 117 IU/L 59 43  AST 0 - 40 IU/L 12 20  ALT 0 - 32 IU/L 7 13     RADIOGRAPHIC STUDIES: I have personally reviewed the radiological images as listed and agreed with the findings in the report. US BREAST LTD UNI LEFT INC AXILLA  Result Date: 01/01/2021 CLINICAL DATA:  Screening recall for a possible left breast mass. EXAM: DIGITAL DIAGNOSTIC LEFT MAMMOGRAM WITH CAD AND TOMO ULTRASOUND LEFT BREAST COMPARISON:  Previous exam(s). ACR Breast Density Category c: The breast tissue is heterogeneously dense, which may obscure small masses. FINDINGS: Possible mass noted in the posterior upper outer left breast persists as a small spiculated mass with associated architectural distortion, approximately 8 mm in size. There are few associated punctate calcifications. There are no other discrete masses or areas of architectural distortion. There are no other suspicious calcifications. Mammographic images were processed with CAD. On physical exam, there is some prominence or thickening in the 1 o'clock region of the left breast without a defined mass. Targeted ultrasound is performed, showing a hypoechoic irregular mass with ill-defined margins, taller than wide, with associated architectural distortion, in the left breast at 1 o'clock, 8 cm from the nipple, measuring 11 x 7 x 8 mm. Sonographic evaluation of the left axilla shows no enlarged or abnormal lymph nodes. IMPRESSION: 1. 11 mm mass in the 1 o'clock position of the left breast highly suspicious for breast malignancy. No left axillary lymphadenopathy. RECOMMENDATION: 1. Ultrasound-guided core needle biopsy the 1 o'clock position left breast mass. This procedure has been scheduled for tomorrow morning at 7:30 a.m. I have discussed the findings and recommendations with the patient. If applicable, a reminder letter will be sent to the  patient regarding the next appointment. BI-RADS CATEGORY  5: Highly suggestive of malignancy. Electronically Signed   By: Lajean Manes M.D.   On: 01/01/2021 15:10   MM DIAG BREAST TOMO UNI LEFT  Result Date: 01/01/2021 CLINICAL DATA:  Screening recall for a possible left breast mass. EXAM: DIGITAL DIAGNOSTIC LEFT MAMMOGRAM WITH CAD AND TOMO ULTRASOUND LEFT BREAST COMPARISON:  Previous exam(s). ACR Breast Density Category c: The breast tissue is heterogeneously dense, which may obscure small masses. FINDINGS: Possible mass noted in the posterior upper outer left breast persists as a small spiculated mass with associated architectural distortion, approximately 8 mm in size. There are few associated punctate calcifications. There are no other discrete masses or  areas of architectural distortion. There are no other suspicious calcifications. Mammographic images were processed with CAD. On physical exam, there is some prominence or thickening in the 1 o'clock region of the left breast without a defined mass. Targeted ultrasound is performed, showing a hypoechoic irregular mass with ill-defined margins, taller than wide, with associated architectural distortion, in the left breast at 1 o'clock, 8 cm from the nipple, measuring 11 x 7 x 8 mm. Sonographic evaluation of the left axilla shows no enlarged or abnormal lymph nodes. IMPRESSION: 1. 11 mm mass in the 1 o'clock position of the left breast highly suspicious for breast malignancy. No left axillary lymphadenopathy. RECOMMENDATION: 1. Ultrasound-guided core needle biopsy the 1 o'clock position left breast mass. This procedure has been scheduled for tomorrow morning at 7:30 a.m. I have discussed the findings and recommendations with the patient. If applicable, a reminder letter will be sent to the patient regarding the next appointment. BI-RADS CATEGORY  5: Highly suggestive of malignancy. Electronically Signed   By: Lajean Manes M.D.   On: 01/01/2021 15:10   MM  CLIP PLACEMENT LEFT  Result Date: 01/01/2021 CLINICAL DATA:  Status post ultrasound-guided core biopsy of LEFT breast mass. EXAM: 3D DIAGNOSTIC LEFT MAMMOGRAM POST ULTRASOUND BIOPSY COMPARISON:  Previous exam(s). FINDINGS: 3D Mammographic images were obtained following ultrasound guided biopsy of mass in the 1 o'clock location of the LEFT breast and placement of a ribbon shaped clip. The biopsy marking clip is in expected position at the site of biopsy. IMPRESSION: Appropriate positioning of the ribbon shaped biopsy marking clip at the site of biopsy in the UPPER OUTER QUADRANT LEFT breast. Final Assessment: Post Procedure Mammograms for Marker Placement Electronically Signed   By: Nolon Nations M.D.   On: 01/01/2021 16:09   [place] LT BREAST BX W LOC DEV 1ST LESION IMG BX SPEC US GUIDE  Addendum Date: 01/05/2021   ADDENDUM REPORT: 01/02/2021 14:26 ADDENDUM: Pathology revealed GRADE II INVASIVE MAMMARY CARCINOMA of the LEFT breast, 1 o'clock, upper outer quadrant, (ribbon clip). This was found to be concordant by Dr. Nolon Nations. Pathology results were discussed with the patient by telephone. The patient reported doing well after the biopsy with tenderness at the site. Post biopsy instructions and care were reviewed and questions were answered. The patient was encouraged to call [facility] for any additional concerns. My direct phone number was provided. Surgical consultation has been arranged with Dr. Rolm Bookbinder at [facility] on January 16, 2021. Pathology results reported by Terie Purser, RN on 01/02/2021. Electronically Signed   By: Nolon Nations M.D.   On: 01/02/2021 14:26   Result Date: 01/05/2021 CLINICAL DATA:  Patient presents for biopsy of LEFT breast mass. EXAM: ULTRASOUND GUIDED LEFT BREAST CORE NEEDLE BIOPSY COMPARISON:  Previous exam(s). PROCEDURE: I met with the patient and we discussed the procedure of ultrasound-guided biopsy, including  benefits and alternatives. We discussed the high likelihood of a successful procedure. We discussed the risks of the procedure, including infection, bleeding, tissue injury, clip migration, and inadequate sampling. Informed written consent was given. The usual time-out protocol was performed immediately prior to the procedure. Lesion quadrant: UPPER-OUTER QUADRANT LEFT breast Using sterile technique and 1% Lidocaine as local anesthetic, under direct ultrasound visualization, a 12 gauge spring-loaded device was used to perform biopsy of mass in the 1 o'clock location of the LEFT breast using a LATERAL to MEDIAL approach. At the conclusion of the procedure ribbon shaped tissue marker clip was  deployed into the biopsy cavity. Follow up 2 view mammogram was performed and dictated separately. IMPRESSION: Ultrasound guided biopsy of LEFT breast mass. No apparent complications. Electronically Signed: By: Nolon Nations M.D. On: 01/01/2021 15:48    ASSESSMENT & PLAN:  Cindy [name] is a 50 y.o. Caucasian female with    1. Malignant neoplasm of upper-outer quadrant of left breast, Stage IA, c(T1cN0M0), ER+/PR+/HER2-, Grade II   -We discussed her image findings and the biopsy results in great details. She was found to have a 54m mass in left breast on mammogram. 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 She does not have to have genetic testing.  -Given the early stage disease, she likely need a lumpectomy with Sentinel LN biopsy. She is agreeable and plans to proceed with surgery on 01/28/21 with Dr WDonne Hazel   -I recommend a Oncotype Dx test on the surgical sample and we'll make a decision about adjuvant chemotherapy based on the Oncotype result. Written material of this test was given to her. She is young and fit, would be a good candidate for chemotherapy if her Oncotype recurrence score is high. -If her  surgical sentinel lymph node positive, I recommend mammaprint for further risk stratification and guide adjuvant chemotherapy. -The risk of recurrence depends on the stage and biology of the tumor. She is early stage, with ER/PR positive and HER2 negative markers. I discussed this is the more common type of slow growing tumor.  -She was also seen by radiation oncologist Dr. KSondra Cometoday. Adjuvant radiation is recommended after lumpectomy to reduce her risk of local recurrence.   -Given the strong ER and PR expression in her pre-menopausal status, I recommend adjuvant endocrine therapy with aromatase inhibitor Tamoxifen for a total of 5-10 years to reduce the risk of cancer recurrence. Potential benefits and side effects were discussed with patient and she is interested. -We also discussed the breast cancer surveillance after her surgery. She will continue annual screening mammogram, self exam, and a routine office visit with lab and exam with [place] -Her left breast mass is barely palpable at 1cm in left breast on exam today (01/21/21).  -She will proceed with surgery next week. I will f/u after her surgery or Radiation.    2. Anxiety  -Not on medication.  -She has been having trouble sleeping recently.  -Will monitor on antiestrogen therapy.    PLAN:  -Proceed with surgery on 2/2 -Oncotype on surgical sample -F/u after Surgery or Radiation    Orders Placed This Encounter  Procedures  . CBC with Differential (Cancer Center Only)    Standing Status:   Future    Standing Expiration Date:   01/21/2022  . CMP ([facility])    Standing Status:   Future    Standing Expiration Date:   01/21/2022    All questions were answered. The patient knows to call the clinic with any problems, questions or concerns. The total time spent in the appointment was 50 minutes.     YTruitt Merle MD 01/21/2021 10:38 PM  I, AJoslyn Devon am acting as scribe for YTruitt Merle MD.   I have reviewed the above  documentation for accuracy and completeness, and I agree with the above.

## 2021-01-19 NOTE — Progress Notes (Signed)
Location of Breast Cancer: left breast  Histology per Pathology Report:  01/01/2021    Receptor Status: ER(+), PR (+), Her2-neu (+), Ki-67(20%)  Did patient present with symptoms (if so, please note symptoms) or was this found on screening mammography?: Screening that resulted in a recall  Past/Anticipated interventions by surgeon, if any: biopsy  Past/Anticipated interventions by medical oncology, if any: scheduled to see Dr. Burr Medico as new patient on 01/21/2021  Lymphedema issues, if any: no  Pain issues, if any: no  SAFETY ISSUES:  Prior radiation? no  Pacemaker/ICD? no  Possible current pregnancy? no  Is the patient on methotrexate? no  Current Complaints / other details:  Husband Shanon Brow is present with her at this visit today.  He had one grown son.  Vitals:   01/20/21 1241  BP: (!) 155/97  Pulse: 77  Resp: 18  Temp: 97.6 F (36.4 C)  TempSrc: Temporal  SpO2: 100%  Weight: 155 lb (70.3 kg)  Height: 5' 2.75" (1.594 m)   Rhae Hammock, RN BSN

## 2021-01-19 NOTE — Progress Notes (Signed)
Radiation Oncology         [phone] ________________________________  Initial Outpatient Consultation  Name: Cindy [name] MRN: [id number]  Date: 01/20/2021  DOB: 08/08/1971  RS:WNIOEV, Herb Grays, PA-C  Rolm Bookbinder, MD   REFERRING PHYSICIAN: Rolm Bookbinder, MD  DIAGNOSIS: The encounter diagnosis was Malignant neoplasm of upper-outer quadrant of left breast in female, estrogen receptor positive ([place]).  Stage IA (T1b, N0) Left Breast UOQ, Invasive Ductal Carcinoma, ER+ / PR+ / Her2-, Grade 2  HISTORY OF PRESENT ILLNESS::Cindy [name] is a 50 y.o. female who is seen as a courtesy of Dr. Donne Hazel for an opinion concerning radiation therapy as part of management for her recently diagnosed breast cancer. Today, she is accompanied by her husband. She had routine screening mammography that showed a possible abnormality in the left breast. She underwent unilateral diagnostic mammography and left breast ultrasonography on 01/01/2021 that showed an 11 mm mass in the 1 o'clock position of the left breast that was highly suspicious for malignancy. There was no left axillary lymphadenopathy noted.  Biopsy on 01/01/2021 showed: grade 2 invasive mammary carcinoma. Immunohistochemistry for E-cadherin was positive, consistent with ductal carcinoma. Prognostic indicators significant for: estrogen receptor, 95% positive with a strong staining intensity and progesterone receptor, 50% positive with a moderate staining intensity. Proliferation marker Ki67 at 20%. HER2 negative.  PREVIOUS RADIATION THERAPY: No  PAST MEDICAL HISTORY:  Past Medical History:  Diagnosis Date  . Anxiety   . Medical history non-contributory     PAST SURGICAL HISTORY: Past Surgical History:  Procedure Laterality Date  . EXCISION OF BREAST BIOPSY    . NO PAST SURGERIES      FAMILY HISTORY:  Family History  Problem Relation Age of Onset  . Cancer Father        brain  . Diabetes Brother   . Cancer Maternal  Grandmother        lung  . Cancer Maternal Grandfather        brain and bone  . Cancer Paternal Grandfather        brain   No family history of breast or ovarian cancer.  SOCIAL HISTORY:  Social History   Tobacco Use  . Smoking status: Never Smoker  . Smokeless tobacco: Never Used  Vaping Use  . Vaping Use: Never used  Substance Use Topics  . Alcohol use: Yes    Alcohol/week: 6.0 standard drinks    Types: 6 Cans of beer per week    Comment: few times a week  . Drug use: No    ALLERGIES:  Allergies  Allergen Reactions  . Latex Swelling and Rash    MEDICATIONS:  No current outpatient medications on file.   No current facility-administered medications for this encounter.    REVIEW OF SYSTEMS:  A 10+ POINT REVIEW OF SYSTEMS WAS OBTAINED including neurology, dermatology, psychiatry, cardiac, respiratory, lymph, extremities, GI, GU, musculoskeletal, constitutional, reproductive, HEENT.  The patient reports palpating the area of concern prior to diagnosis.  She also reports having some pain in the general area prior to diagnosis.   PHYSICAL EXAM:  height is 5' 2.75" (1.594 m) and weight is 155 lb (70.3 kg). Her temporal temperature is 97.6 F (36.4 C). Her blood pressure is 155/97 (abnormal) and her pulse is 77. Her respiration is 18 and oxygen saturation is 100%.   General: Alert and oriented, in no acute distress HEENT: Head is normocephalic. Extraocular movements are intact.  Neck: Neck is supple, no palpable cervical or supraclavicular lymphadenopathy. Heart:  Regular in rate and rhythm with no murmurs, rubs, or gallops. Chest: Clear to auscultation bilaterally, with no rhonchi, wheezes, or rales. Abdomen: Soft, nontender, nondistended, with no rigidity or guarding. Extremities: No cyanosis or edema. Lymphatics: see Neck Exam Skin: No concerning lesions. Musculoskeletal: symmetric strength and muscle tone throughout. Neurologic: Cranial nerves II through XII are grossly  intact. No obvious focalities. Speech is fluent. Coordination is intact. Psychiatric: Judgment and insight are intact. Affect is appropriate. Right breast: No palpable mass, nipple discharge, or bleeding.  Somewhat large and pendulous. Left breast: Somewhat large and pendulous.  There is biopsy site in the upper outer quadrant with some mild bruising in this area.  Some palpable induration measuring approximately 1 cm, bruising/hematoma versus palpable mass  ECOG = 1  0 - Asymptomatic (Fully active, able to carry on all predisease activities without restriction)  1 - Symptomatic but completely ambulatory (Restricted in physically strenuous activity but ambulatory and able to carry out work of a light or sedentary nature. For example, light housework, office work)  2 - Symptomatic, <50% in bed during the day (Ambulatory and capable of all self care but unable to carry out any work activities. Up and about more than 50% of waking hours)  3 - Symptomatic, >50% in bed, but not bedbound (Capable of only limited self-care, confined to bed or chair 50% or more of waking hours)  4 - Bedbound (Completely disabled. Cannot carry on any self-care. Totally confined to bed or chair)  5 - Death   Eustace Pen MM, Creech RH, Tormey DC, et al. [phone]). "Toxicity and response criteria of the [facility]". [place] Oncol. 5 (6): 649-55  LABORATORY DATA:  Lab Results  Component Value Date   WBC 4.6 07/03/2019   HGB 14.1 07/03/2019   HCT 41.1 07/03/2019   MCV 94 07/03/2019   PLT 317 07/03/2019   NEUTROABS 3.1 07/03/2019   Lab Results  Component Value Date   NA 137 07/03/2019   K 4.1 07/03/2019   CL 97 07/03/2019   CO2 21 07/03/2019   GLUCOSE 96 07/03/2019   CREATININE 0.70 07/03/2019   CALCIUM 9.8 07/03/2019      RADIOGRAPHY: US BREAST LTD UNI LEFT INC AXILLA  Result Date: 01/01/2021 CLINICAL DATA:  Screening recall for a possible left breast mass. EXAM: DIGITAL DIAGNOSTIC  LEFT MAMMOGRAM WITH CAD AND TOMO ULTRASOUND LEFT BREAST COMPARISON:  Previous exam(s). ACR Breast Density Category c: The breast tissue is heterogeneously dense, which may obscure small masses. FINDINGS: Possible mass noted in the posterior upper outer left breast persists as a small spiculated mass with associated architectural distortion, approximately 8 mm in size. There are few associated punctate calcifications. There are no other discrete masses or areas of architectural distortion. There are no other suspicious calcifications. Mammographic images were processed with CAD. On physical exam, there is some prominence or thickening in the 1 o'clock region of the left breast without a defined mass. Targeted ultrasound is performed, showing a hypoechoic irregular mass with ill-defined margins, taller than wide, with associated architectural distortion, in the left breast at 1 o'clock, 8 cm from the nipple, measuring 11 x 7 x 8 mm. Sonographic evaluation of the left axilla shows no enlarged or abnormal lymph nodes. IMPRESSION: 1. 11 mm mass in the 1 o'clock position of the left breast highly suspicious for breast malignancy. No left axillary lymphadenopathy. RECOMMENDATION: 1. Ultrasound-guided core needle biopsy the 1 o'clock position left breast mass. This procedure has been  scheduled for tomorrow morning at 7:30 a.m. I have discussed the findings and recommendations with the patient. If applicable, a reminder letter will be sent to the patient regarding the next appointment. BI-RADS CATEGORY  5: Highly suggestive of malignancy. Electronically Signed   By: Lajean Manes M.D.   On: 01/01/2021 15:10   MM DIAG BREAST TOMO UNI LEFT  Result Date: 01/01/2021 CLINICAL DATA:  Screening recall for a possible left breast mass. EXAM: DIGITAL DIAGNOSTIC LEFT MAMMOGRAM WITH CAD AND TOMO ULTRASOUND LEFT BREAST COMPARISON:  Previous exam(s). ACR Breast Density Category c: The breast tissue is heterogeneously dense, which may  obscure small masses. FINDINGS: Possible mass noted in the posterior upper outer left breast persists as a small spiculated mass with associated architectural distortion, approximately 8 mm in size. There are few associated punctate calcifications. There are no other discrete masses or areas of architectural distortion. There are no other suspicious calcifications. Mammographic images were processed with CAD. On physical exam, there is some prominence or thickening in the 1 o'clock region of the left breast without a defined mass. Targeted ultrasound is performed, showing a hypoechoic irregular mass with ill-defined margins, taller than wide, with associated architectural distortion, in the left breast at 1 o'clock, 8 cm from the nipple, measuring 11 x 7 x 8 mm. Sonographic evaluation of the left axilla shows no enlarged or abnormal lymph nodes. IMPRESSION: 1. 11 mm mass in the 1 o'clock position of the left breast highly suspicious for breast malignancy. No left axillary lymphadenopathy. RECOMMENDATION: 1. Ultrasound-guided core needle biopsy the 1 o'clock position left breast mass. This procedure has been scheduled for tomorrow morning at 7:30 a.m. I have discussed the findings and recommendations with the patient. If applicable, a reminder letter will be sent to the patient regarding the next appointment. BI-RADS CATEGORY  5: Highly suggestive of malignancy. Electronically Signed   By: Lajean Manes M.D.   On: 01/01/2021 15:10   MM CLIP PLACEMENT LEFT  Result Date: 01/01/2021 CLINICAL DATA:  Status post ultrasound-guided core biopsy of LEFT breast mass. EXAM: 3D DIAGNOSTIC LEFT MAMMOGRAM POST ULTRASOUND BIOPSY COMPARISON:  Previous exam(s). FINDINGS: 3D Mammographic images were obtained following ultrasound guided biopsy of mass in the 1 o'clock location of the LEFT breast and placement of a ribbon shaped clip. The biopsy marking clip is in expected position at the site of biopsy. IMPRESSION: Appropriate  positioning of the ribbon shaped biopsy marking clip at the site of biopsy in the UPPER OUTER QUADRANT LEFT breast. Final Assessment: Post Procedure Mammograms for Marker Placement Electronically Signed   By: Nolon Nations M.D.   On: 01/01/2021 16:09   [place] LT BREAST BX W LOC DEV 1ST LESION IMG BX SPEC US GUIDE  Addendum Date: 01/05/2021   ADDENDUM REPORT: 01/02/2021 14:26 ADDENDUM: Pathology revealed GRADE II INVASIVE MAMMARY CARCINOMA of the LEFT breast, 1 o'clock, upper outer quadrant, (ribbon clip). This was found to be concordant by Dr. Nolon Nations. Pathology results were discussed with the patient by telephone. The patient reported doing well after the biopsy with tenderness at the site. Post biopsy instructions and care were reviewed and questions were answered. The patient was encouraged to call [facility] for any additional concerns. My direct phone number was provided. Surgical consultation has been arranged with Dr. Rolm Bookbinder at [facility] on January 16, 2021. Pathology results reported by Terie Purser, RN on 01/02/2021. Electronically Signed   By: Nolon Nations M.D.   On:  01/02/2021 14:26   Result Date: 01/05/2021 CLINICAL DATA:  Patient presents for biopsy of LEFT breast mass. EXAM: ULTRASOUND GUIDED LEFT BREAST CORE NEEDLE BIOPSY COMPARISON:  Previous exam(s). PROCEDURE: I met with the patient and we discussed the procedure of ultrasound-guided biopsy, including benefits and alternatives. We discussed the high likelihood of a successful procedure. We discussed the risks of the procedure, including infection, bleeding, tissue injury, clip migration, and inadequate sampling. Informed written consent was given. The usual time-out protocol was performed immediately prior to the procedure. Lesion quadrant: UPPER-OUTER QUADRANT LEFT breast Using sterile technique and 1% Lidocaine as local anesthetic, under direct ultrasound visualization, a 12  gauge spring-loaded device was used to perform biopsy of mass in the 1 o'clock location of the LEFT breast using a LATERAL to MEDIAL approach. At the conclusion of the procedure ribbon shaped tissue marker clip was deployed into the biopsy cavity. Follow up 2 view mammogram was performed and dictated separately. IMPRESSION: Ultrasound guided biopsy of LEFT breast mass. No apparent complications. Electronically Signed: By: Nolon Nations M.D. On: 01/01/2021 15:48      IMPRESSION: Stage IA (T1b, N0) Left Breast UOQ, Invasive Ductal Carcinoma, ER+ / PR+ / Her2-, Grade 2  The patient would be a good candidate for breast conservation therapy with lumpectomy sentinel node procedure followed by adjuvant radiation therapy.  She is interested in this treatment approach and will be having surgery in early February.  The patient would appear to be a candidate for hypofractionated accelerated radiation therapy over approximately 4 weeks unless her lymph nodes turn out to be positive.  Will use cardiac sparing techniques if necessary.  Today, I talked to the patient and husband about the findings and work-up thus far.  We discussed the natural history of breast cancer and general treatment, highlighting the role of radiotherapy in the management.  We discussed the available radiation techniques, and focused on the details of logistics and delivery.  We reviewed the anticipated acute and late sequelae associated with radiation in this setting.  The patient was encouraged to ask questions that I answered to the best of my ability.  PLAN: 1.  Lumpectomy/sentinel node procedure 2.  Oncotype Dx to determine the potential benefit of adjuvant chemotherapy 3.  Adjuvant radiation therapy 4.  Adjuvant hormonal therapy pending medical oncology consultation   Total time spent in this encounter was 45 minutes which included reviewing the patient's most recent mammogram, ultrasound, biopsy, pathology report, physical  examination, and documentation.   ------------------------------------------------  Blair Promise, PhD, MD  This document serves as a record of services personally performed by Gery Pray, MD. It was created on his behalf by Clerance Lav, a trained medical scribe. The creation of this record is based on the scribe's personal observations and the provider's statements to them. This document has been checked and approved by the attending provider.

## 2021-01-20 ENCOUNTER — Ambulatory Visit
Admission: RE | Admit: 2021-01-20 | Discharge: 2021-01-20 | Disposition: A | Discharge status: Home / Self Care | Payer: Managed Care, Other (non HMO) | Source: Ambulatory Visit | Attending: Radiation Oncology | Admitting: Radiation Oncology

## 2021-01-20 ENCOUNTER — Encounter: Payer: Self-pay | Admitting: Radiation Oncology

## 2021-01-20 ENCOUNTER — Other Ambulatory Visit: Payer: Self-pay

## 2021-01-20 DIAGNOSIS — Z170 Estrogen receptor positive status [ER+]: Secondary | ICD-10-CM

## 2021-01-20 DIAGNOSIS — Z808 Family history of malignant neoplasm of other organs or systems: Secondary | ICD-10-CM | POA: Diagnosis not present

## 2021-01-20 DIAGNOSIS — C50412 Malignant neoplasm of upper-outer quadrant of left female breast: Secondary | ICD-10-CM

## 2021-01-20 DIAGNOSIS — Z801 Family history of malignant neoplasm of trachea, bronchus and lung: Secondary | ICD-10-CM | POA: Diagnosis not present

## 2021-01-20 HISTORY — DX: Anxiety disorder, unspecified: F41.9

## 2021-01-20 NOTE — Progress Notes (Signed)
See md note for nursing assessment

## 2021-01-21 ENCOUNTER — Encounter: Payer: Self-pay | Admitting: Hematology

## 2021-01-21 ENCOUNTER — Encounter: Payer: Self-pay | Admitting: *Deleted

## 2021-01-21 ENCOUNTER — Encounter (HOSPITAL_BASED_OUTPATIENT_CLINIC_OR_DEPARTMENT_OTHER): Payer: Self-pay | Admitting: General Surgery

## 2021-01-21 ENCOUNTER — Other Ambulatory Visit: Payer: Self-pay

## 2021-01-21 ENCOUNTER — Encounter: Payer: Self-pay | Admitting: Physical Therapy

## 2021-01-21 ENCOUNTER — Inpatient Hospital Stay: Payer: Managed Care, Other (non HMO) | Attending: Hematology | Admitting: Hematology

## 2021-01-21 ENCOUNTER — Ambulatory Visit: Payer: Managed Care, Other (non HMO) | Admitting: Physical Therapy

## 2021-01-21 ENCOUNTER — Other Ambulatory Visit: Payer: Self-pay | Admitting: General Surgery

## 2021-01-21 VITALS — BP 144/82 | HR 77 | Temp 97.7°F | Resp 14 | Ht 62.75 in | Wt 153.8 lb

## 2021-01-21 DIAGNOSIS — R293 Abnormal posture: Secondary | ICD-10-CM | POA: Insufficient documentation

## 2021-01-21 DIAGNOSIS — Z801 Family history of malignant neoplasm of trachea, bronchus and lung: Secondary | ICD-10-CM | POA: Insufficient documentation

## 2021-01-21 DIAGNOSIS — C50412 Malignant neoplasm of upper-outer quadrant of left female breast: Secondary | ICD-10-CM

## 2021-01-21 DIAGNOSIS — F419 Anxiety disorder, unspecified: Secondary | ICD-10-CM | POA: Diagnosis not present

## 2021-01-21 DIAGNOSIS — M25612 Stiffness of left shoulder, not elsewhere classified: Secondary | ICD-10-CM | POA: Insufficient documentation

## 2021-01-21 DIAGNOSIS — Z808 Family history of malignant neoplasm of other organs or systems: Secondary | ICD-10-CM | POA: Diagnosis not present

## 2021-01-21 DIAGNOSIS — G8929 Other chronic pain: Secondary | ICD-10-CM | POA: Insufficient documentation

## 2021-01-21 DIAGNOSIS — Z170 Estrogen receptor positive status [ER+]: Secondary | ICD-10-CM | POA: Insufficient documentation

## 2021-01-21 DIAGNOSIS — M25512 Pain in left shoulder: Secondary | ICD-10-CM | POA: Insufficient documentation

## 2021-01-21 NOTE — Progress Notes (Signed)
[facility] Psychosocial Distress Screening Clinical Social Work  Clinical Social Work was referred by distress screening protocol.  The patient scored a 7 on the Psychosocial Distress Thermometer which indicates moderate distress. Clinical Social Worker contacted patient by phone to assess for distress and other psychosocial needs.  Patient stated she was feeling overwhelmed and anxious due to new diagnosis, number of appointments and information, but stated she felt supported.  CSW normalized patients feelings as CSW and patient discussed common feelings and emotions when being diagnosed with cancer and the importance of support.  CSW provided education on CSW role and the support tram at [facility].  Patient was appreciative of CSW contact.  CSW provided contact information and encouraged patient to call with questions or concerns.       ONCBCN DISTRESS SCREENING 01/20/2021  Screening Type Initial Screening  Distress experienced in past week (1-10) 7  Emotional problem type Nervousness/Anxiety;Adjusting to illness  Physical Problem type Sleep/insomnia  Physician notified of physical symptoms Yes  Referral to clinical psychology No  Referral to clinical social work Yes  Referral to dietition No  Referral to financial advocate No  Referral to support programs No  Referral to palliative care No    Johnnye Lana, MSW, LCSW, OSW-C Clinical Social Worker [place] [phone]
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
## 2021-01-22 ENCOUNTER — Telehealth: Payer: Self-pay | Admitting: *Deleted

## 2021-01-22 ENCOUNTER — Encounter: Payer: Self-pay | Admitting: *Deleted

## 2021-01-22 NOTE — Telephone Encounter (Signed)
Spoke to pt, provided navigation resources and contact information. Denies questions or concerns regarding dx or treatment care plan. Encourage pt to call with needs. Received verbal understanding.

## 2021-01-23 ENCOUNTER — Encounter: Payer: Self-pay | Admitting: Rehabilitation

## 2021-01-23 ENCOUNTER — Other Ambulatory Visit: Payer: Self-pay

## 2021-01-23 ENCOUNTER — Ambulatory Visit: Payer: Managed Care, Other (non HMO) | Admitting: Rehabilitation

## 2021-01-23 DIAGNOSIS — G8929 Other chronic pain: Secondary | ICD-10-CM

## 2021-01-23 DIAGNOSIS — Z170 Estrogen receptor positive status [ER+]: Secondary | ICD-10-CM

## 2021-01-23 DIAGNOSIS — M25612 Stiffness of left shoulder, not elsewhere classified: Secondary | ICD-10-CM

## 2021-01-23 DIAGNOSIS — C50412 Malignant neoplasm of upper-outer quadrant of left female breast: Secondary | ICD-10-CM | POA: Diagnosis not present

## 2021-01-23 DIAGNOSIS — R293 Abnormal posture: Secondary | ICD-10-CM

## 2021-01-23 NOTE — Patient Instructions (Signed)
Access Code: [email or web address]: [email or web address]: 01/28/2022Prepared by: Marcene Brawn TevisExercises  Single Arm Doorway Pec Stretch at 90 Degrees Abduction - 1-2 x daily - 7 x weekly - 3 reps - 20-60 seconds hold  Supine Shoulder External Rotation with [place] - 1-2 x daily - 7 x weekly - 10 reps - 5 sseconds hold  Supine Shoulder External Rotation Stretch - 1-2 x daily - 7 x weekly - 1 sets - 4-5 reps - 20-30 seconds hold
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
## 2021-01-24 ENCOUNTER — Other Ambulatory Visit (HOSPITAL_COMMUNITY)
Admission: RE | Admit: 2021-01-24 | Discharge: 2021-01-24 | Disposition: A | Discharge status: Home / Self Care | Payer: Managed Care, Other (non HMO) | Source: Ambulatory Visit | Attending: General Surgery | Admitting: General Surgery

## 2021-01-24 DIAGNOSIS — Z01812 Encounter for preprocedural laboratory examination: Secondary | ICD-10-CM | POA: Diagnosis present

## 2021-01-24 DIAGNOSIS — Z20822 Contact with and (suspected) exposure to covid-19: Secondary | ICD-10-CM | POA: Insufficient documentation

## 2021-01-24 LAB — SARS CORONAVIRUS 2 (TAT 6-24 HRS): SARS Coronavirus 2: NEGATIVE

## 2021-01-26 ENCOUNTER — Ambulatory Visit: Payer: Managed Care, Other (non HMO) | Admitting: Physical Therapy

## 2021-01-26 ENCOUNTER — Encounter: Payer: Self-pay | Admitting: Physical Therapy

## 2021-01-26 ENCOUNTER — Other Ambulatory Visit: Payer: Self-pay

## 2021-01-26 DIAGNOSIS — M25612 Stiffness of left shoulder, not elsewhere classified: Secondary | ICD-10-CM

## 2021-01-26 DIAGNOSIS — C50412 Malignant neoplasm of upper-outer quadrant of left female breast: Secondary | ICD-10-CM | POA: Diagnosis not present

## 2021-01-26 DIAGNOSIS — G8929 Other chronic pain: Secondary | ICD-10-CM

## 2021-01-26 DIAGNOSIS — M25512 Pain in left shoulder: Secondary | ICD-10-CM

## 2021-01-26 DIAGNOSIS — R293 Abnormal posture: Secondary | ICD-10-CM
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
## 2021-01-27 ENCOUNTER — Ambulatory Visit
Admission: RE | Admit: 2021-01-27 | Discharge: 2021-01-27 | Disposition: A | Discharge status: Home / Self Care | Payer: Managed Care, Other (non HMO) | Source: Ambulatory Visit | Attending: General Surgery | Admitting: General Surgery

## 2021-01-27 ENCOUNTER — Other Ambulatory Visit: Payer: Self-pay | Admitting: General Surgery

## 2021-01-27 ENCOUNTER — Ambulatory Visit: Payer: Managed Care, Other (non HMO) | Admitting: Physical Therapy

## 2021-01-27 DIAGNOSIS — C50412 Malignant neoplasm of upper-outer quadrant of left female breast: Secondary | ICD-10-CM

## 2021-01-27 DIAGNOSIS — Z170 Estrogen receptor positive status [ER+]: Secondary | ICD-10-CM

## 2021-01-27 NOTE — Progress Notes (Signed)

## 2021-01-28 ENCOUNTER — Ambulatory Visit (HOSPITAL_BASED_OUTPATIENT_CLINIC_OR_DEPARTMENT_OTHER): Payer: Managed Care, Other (non HMO) | Admitting: Anesthesiology

## 2021-01-28 ENCOUNTER — Encounter (HOSPITAL_COMMUNITY)
Admission: RE | Admit: 2021-01-28 | Discharge: 2021-01-28 | Disposition: A | Discharge status: Home / Self Care | Payer: Managed Care, Other (non HMO) | Source: Ambulatory Visit | Attending: General Surgery | Admitting: General Surgery

## 2021-01-28 ENCOUNTER — Other Ambulatory Visit: Payer: Self-pay

## 2021-01-28 ENCOUNTER — Ambulatory Visit (HOSPITAL_BASED_OUTPATIENT_CLINIC_OR_DEPARTMENT_OTHER)
Admission: RE | Admit: 2021-01-28 | Discharge: 2021-01-28 | Disposition: A | Discharge status: Home / Self Care | Payer: Managed Care, Other (non HMO) | Attending: General Surgery | Admitting: General Surgery

## 2021-01-28 ENCOUNTER — Encounter (HOSPITAL_BASED_OUTPATIENT_CLINIC_OR_DEPARTMENT_OTHER): Payer: Self-pay | Admitting: General Surgery

## 2021-01-28 ENCOUNTER — Ambulatory Visit
Admission: RE | Admit: 2021-01-28 | Discharge: 2021-01-28 | Disposition: A | Discharge status: Home / Self Care | Payer: Managed Care, Other (non HMO) | Source: Ambulatory Visit | Attending: General Surgery | Admitting: General Surgery

## 2021-01-28 ENCOUNTER — Encounter (HOSPITAL_BASED_OUTPATIENT_CLINIC_OR_DEPARTMENT_OTHER)
Admission: RE | Disposition: A | Discharge status: Home / Self Care | Payer: Self-pay | Source: Home / Self Care | Attending: General Surgery

## 2021-01-28 DIAGNOSIS — C50412 Malignant neoplasm of upper-outer quadrant of left female breast: Secondary | ICD-10-CM | POA: Insufficient documentation

## 2021-01-28 DIAGNOSIS — Z170 Estrogen receptor positive status [ER+]: Secondary | ICD-10-CM | POA: Insufficient documentation

## 2021-01-28 HISTORY — PX: BREAST LUMPECTOMY WITH RADIOACTIVE SEED AND SENTINEL LYMPH NODE BIOPSY: SHX6550

## 2021-01-28 LAB — POCT PREGNANCY, URINE: Preg Test, Ur: NEGATIVE

## 2021-01-28 SURGERY — BREAST LUMPECTOMY WITH RADIOACTIVE SEED AND SENTINEL LYMPH NODE BIOPSY
Anesthesia: General | Site: Breast | Laterality: Left

## 2021-01-28 MED ORDER — DEXAMETHASONE SODIUM PHOSPHATE 4 MG/ML IJ SOLN
INTRAMUSCULAR | Status: DC | PRN
  Administered 2021-01-28: 10 mg via INTRAVENOUS

## 2021-01-28 MED ORDER — OXYCODONE HCL 5 MG PO TABS: 5.0000 mg | ORAL_TABLET | Freq: Four times a day (QID) | ORAL | 0 refills | Status: DC | PRN

## 2021-01-28 MED ORDER — LIDOCAINE 2% (20 MG/ML) 5 ML SYRINGE
INTRAMUSCULAR | Status: AC
  Filled 2021-01-28: qty 5

## 2021-01-28 MED ORDER — BUPIVACAINE HCL (PF) 0.25 % IJ SOLN
INTRAMUSCULAR | Status: DC | PRN
  Administered 2021-01-28: 5 mL

## 2021-01-28 MED ORDER — CEFAZOLIN SODIUM-DEXTROSE 2-4 GM/100ML-% IV SOLN
INTRAVENOUS | Status: AC
  Filled 2021-01-28: qty 100

## 2021-01-28 MED ORDER — TECHNETIUM TC 99M TILMANOCEPT KIT
1.0000 | PACK | Freq: Once | INTRAVENOUS | Status: AC | PRN
  Administered 2021-01-28: 1 via INTRADERMAL

## 2021-01-28 MED ORDER — PROPOFOL 10 MG/ML IV BOLUS
INTRAVENOUS | Status: DC | PRN
  Administered 2021-01-28: 180 mg via INTRAVENOUS

## 2021-01-28 MED ORDER — BUPIVACAINE-EPINEPHRINE (PF) 0.5% -1:200000 IJ SOLN
INTRAMUSCULAR | Status: DC | PRN
  Administered 2021-01-28: 20 mL via PERINEURAL

## 2021-01-28 MED ORDER — ACETAMINOPHEN 500 MG PO TABS
ORAL_TABLET | ORAL | Status: AC
  Filled 2021-01-28: qty 2

## 2021-01-28 MED ORDER — BUPIVACAINE HCL (PF) 0.25 % IJ SOLN
INTRAMUSCULAR | Status: AC
  Filled 2021-01-28: qty 30

## 2021-01-28 MED ORDER — FENTANYL CITRATE (PF) 100 MCG/2ML IJ SOLN
INTRAMUSCULAR | Status: DC | PRN
  Administered 2021-01-28: 50 ug via INTRAVENOUS

## 2021-01-28 MED ORDER — FENTANYL CITRATE (PF) 100 MCG/2ML IJ SOLN: 25.0000 ug | INTRAMUSCULAR | Status: DC | PRN

## 2021-01-28 MED ORDER — ENSURE PRE-SURGERY PO LIQD: 296.0000 mL | Freq: Once | ORAL | Status: DC

## 2021-01-28 MED ORDER — ONDANSETRON HCL 4 MG/2ML IJ SOLN
INTRAMUSCULAR | Status: AC
  Filled 2021-01-28: qty 2

## 2021-01-28 MED ORDER — LIDOCAINE 2% (20 MG/ML) 5 ML SYRINGE
INTRAMUSCULAR | Status: DC | PRN
  Administered 2021-01-28: 60 mg via INTRAVENOUS

## 2021-01-28 MED ORDER — FENTANYL CITRATE (PF) 100 MCG/2ML IJ SOLN
INTRAMUSCULAR | Status: AC
  Filled 2021-01-28: qty 2

## 2021-01-28 MED ORDER — LACTATED RINGERS IV SOLN: INTRAVENOUS | Status: DC

## 2021-01-28 MED ORDER — OXYCODONE HCL 5 MG/5ML PO SOLN: 5.0000 mg | Freq: Once | ORAL | Status: DC | PRN

## 2021-01-28 MED ORDER — ONDANSETRON HCL 4 MG/2ML IJ SOLN: 4.0000 mg | Freq: Once | INTRAMUSCULAR | Status: DC | PRN

## 2021-01-28 MED ORDER — KETOROLAC TROMETHAMINE 15 MG/ML IJ SOLN
15.0000 mg | INTRAMUSCULAR | Status: AC
  Administered 2021-01-28: 15 mg via INTRAVENOUS

## 2021-01-28 MED ORDER — FENTANYL CITRATE (PF) 100 MCG/2ML IJ SOLN
100.0000 ug | Freq: Once | INTRAMUSCULAR | Status: AC
  Administered 2021-01-28 (×2): 50 ug via INTRAVENOUS

## 2021-01-28 MED ORDER — ONDANSETRON HCL 4 MG/2ML IJ SOLN
INTRAMUSCULAR | Status: DC | PRN
  Administered 2021-01-28: 4 mg via INTRAVENOUS

## 2021-01-28 MED ORDER — ACETAMINOPHEN 500 MG PO TABS
1000.0000 mg | ORAL_TABLET | ORAL | Status: AC
  Administered 2021-01-28: 1000 mg via ORAL

## 2021-01-28 MED ORDER — KETOROLAC TROMETHAMINE 15 MG/ML IJ SOLN
INTRAMUSCULAR | Status: AC
  Filled 2021-01-28: qty 1

## 2021-01-28 MED ORDER — MIDAZOLAM HCL 2 MG/2ML IJ SOLN
INTRAMUSCULAR | Status: AC
  Filled 2021-01-28: qty 2

## 2021-01-28 MED ORDER — BUPIVACAINE LIPOSOME 1.3 % IJ SUSP
INTRAMUSCULAR | Status: DC | PRN
  Administered 2021-01-28: 10 mL via PERINEURAL

## 2021-01-28 MED ORDER — OXYCODONE HCL 5 MG PO TABS: 5.0000 mg | ORAL_TABLET | Freq: Once | ORAL | Status: DC | PRN

## 2021-01-28 MED ORDER — METHYLENE BLUE 0.5 % INJ SOLN
INTRAVENOUS | Status: AC
  Filled 2021-01-28: qty 10

## 2021-01-28 MED ORDER — CEFAZOLIN SODIUM-DEXTROSE 2-4 GM/100ML-% IV SOLN
2.0000 g | INTRAVENOUS | Status: AC
  Administered 2021-01-28: 2 g via INTRAVENOUS

## 2021-01-28 MED ORDER — DEXAMETHASONE SODIUM PHOSPHATE 10 MG/ML IJ SOLN
INTRAMUSCULAR | Status: AC
  Filled 2021-01-28: qty 1

## 2021-01-28 MED ORDER — SODIUM CHLORIDE (PF) 0.9 % IJ SOLN
INTRAMUSCULAR | Status: AC
  Filled 2021-01-28: qty 10

## 2021-01-28 MED ORDER — MIDAZOLAM HCL 2 MG/2ML IJ SOLN
2.0000 mg | Freq: Once | INTRAMUSCULAR | Status: AC
  Administered 2021-01-28: 2 mg via INTRAVENOUS

## 2021-01-28 SURGICAL SUPPLY — 57 items
APPLIER CLIP 9.375 MED OPEN (MISCELLANEOUS) ×2
BINDER BREAST LRG (GAUZE/BANDAGES/DRESSINGS) ×2 IMPLANT
BINDER BREAST MEDIUM (GAUZE/BANDAGES/DRESSINGS) IMPLANT
BINDER BREAST XLRG (GAUZE/BANDAGES/DRESSINGS) ×2 IMPLANT
BINDER BREAST XXLRG (GAUZE/BANDAGES/DRESSINGS) IMPLANT
BLADE SURG 15 STRL LF DISP TIS (BLADE) ×1 IMPLANT
BLADE SURG 15 STRL SS (BLADE) ×1
CANISTER SUC SOCK COL 7IN (MISCELLANEOUS) IMPLANT
CANISTER SUCT 1200ML W/VALVE (MISCELLANEOUS) IMPLANT
CHLORAPREP W/TINT 26 (MISCELLANEOUS) ×2 IMPLANT
CLIP APPLIE 9.375 MED OPEN (MISCELLANEOUS) ×1 IMPLANT
CLIP VESOCCLUDE SM WIDE 6/CT (CLIP) IMPLANT
COVER BACK TABLE 60X90IN (DRAPES) ×2 IMPLANT
COVER MAYO STAND STRL (DRAPES) ×2 IMPLANT
COVER PROBE W GEL 5X96 (DRAPES) ×2 IMPLANT
COVER WAND RF STERILE (DRAPES) IMPLANT
DECANTER SPIKE VIAL GLASS SM (MISCELLANEOUS) IMPLANT
DERMABOND ADVANCED (GAUZE/BANDAGES/DRESSINGS) ×1
DERMABOND ADVANCED .7 DNX12 (GAUZE/BANDAGES/DRESSINGS) ×1 IMPLANT
DRAPE GAMMA PROBE CRDLSS 10X38 (DRAPES) ×2 IMPLANT
DRAPE LAPAROSCOPIC ABDOMINAL (DRAPES) ×2 IMPLANT
DRAPE UTILITY XL STRL (DRAPES) ×2 IMPLANT
ELECT COATED BLADE 2.86 ST (ELECTRODE) ×2 IMPLANT
ELECT REM PT RETURN 9FT ADLT (ELECTROSURGICAL) ×2
ELECTRODE REM PT RTRN 9FT ADLT (ELECTROSURGICAL) ×1 IMPLANT
GLOVE SURG ENC MOIS LTX SZ7 (GLOVE) IMPLANT
GLOVE SURG SS PI 7.0 STRL IVOR (GLOVE) ×6 IMPLANT
GLOVE SURG UNDER POLY LF SZ7 (GLOVE) ×2 IMPLANT
GLOVE SURG UNDER POLY LF SZ7.5 (GLOVE) ×2 IMPLANT
GOWN STRL REUS W/ TWL LRG LVL3 (GOWN DISPOSABLE) ×2 IMPLANT
GOWN STRL REUS W/TWL LRG LVL3 (GOWN DISPOSABLE) ×2
HEMOSTAT ARISTA ABSORB 3G PWDR (HEMOSTASIS) IMPLANT
KIT MARKER MARGIN INK (KITS) ×2 IMPLANT
NDL SAFETY ECLIPSE 18X1.5 (NEEDLE) IMPLANT
NEEDLE HYPO 18GX1.5 SHARP (NEEDLE)
NEEDLE HYPO 25X1 1.5 SAFETY (NEEDLE) ×2 IMPLANT
NS IRRIG 1000ML POUR BTL (IV SOLUTION) ×2 IMPLANT
PACK BASIN DAY SURGERY FS (CUSTOM PROCEDURE TRAY) ×2 IMPLANT
PENCIL SMOKE EVACUATOR (MISCELLANEOUS) ×2 IMPLANT
RETRACTOR ONETRAX LX 90X20 (MISCELLANEOUS) ×2 IMPLANT
SLEEVE SCD COMPRESS KNEE MED (MISCELLANEOUS) ×2 IMPLANT
SPONGE LAP 4X18 RFD (DISPOSABLE) ×4 IMPLANT
STRIP CLOSURE SKIN 1/2X4 (GAUZE/BANDAGES/DRESSINGS) ×2 IMPLANT
SUT ETHILON 2 0 FS 18 (SUTURE) IMPLANT
SUT MNCRL AB 4-0 PS2 18 (SUTURE) ×2 IMPLANT
SUT MON AB 5-0 PS2 18 (SUTURE) IMPLANT
SUT SILK 2 0 SH (SUTURE) ×4 IMPLANT
SUT VIC AB 2-0 SH 27 (SUTURE) ×2
SUT VIC AB 2-0 SH 27XBRD (SUTURE) ×2 IMPLANT
SUT VIC AB 3-0 SH 27 (SUTURE) ×1
SUT VIC AB 3-0 SH 27X BRD (SUTURE) ×1 IMPLANT
SUT VIC AB 5-0 PS2 18 (SUTURE) IMPLANT
SYR CONTROL 10ML LL (SYRINGE) ×2 IMPLANT
TOWEL GREEN STERILE FF (TOWEL DISPOSABLE) ×2 IMPLANT
TRAY FAXITRON CT DISP (TRAY / TRAY PROCEDURE) ×2 IMPLANT
TUBE CONNECTING 20X1/4 (TUBING) ×2 IMPLANT
YANKAUER SUCT BULB TIP NO VENT (SUCTIONS) ×2 IMPLANT

## 2021-01-28 NOTE — Anesthesia Postprocedure Evaluation (Signed)
Anesthesia Post Note  Patient: Cindy [name]  Procedure(s) Performed: LEFT BREAST LUMPECTOMY WITH RADIOACTIVE SEED AND LEFT AXILLARY SENTINEL LYMPH NODE BIOPSY (Left Breast)     Patient location during evaluation: PACU Anesthesia Type: General Level of consciousness: awake and alert and oriented Pain management: pain level controlled Vital Signs Assessment: post-procedure vital signs reviewed and stable Respiratory status: spontaneous breathing, nonlabored ventilation and respiratory function stable Cardiovascular status: blood pressure returned to baseline and stable Postop Assessment: no apparent nausea or vomiting Anesthetic complications: no   No complications documented.  Last Vitals:  Vitals:   01/28/21 1125 01/28/21 1140  BP: 127/80 (!) 150/85  Pulse: 89 72  Resp: 14 18  Temp:  36.6 C  SpO2: 99% 100%    Last Pain:  Vitals:   01/28/21 1140  TempSrc:   PainSc: 2                  Charmel Pronovost A.

## 2021-01-28 NOTE — Anesthesia Preprocedure Evaluation (Addendum)
Anesthesia Evaluation  Patient identified by MRN, date of birth, ID band Patient awake    Reviewed: Allergy & Precautions, NPO status , Patient's Chart, lab work & pertinent test results  Airway Mallampati: II  TM Distance: >3 FB Neck ROM: Full    Dental  (+) Chipped, Poor Dentition, Dental Advisory Given   Pulmonary neg pulmonary ROS,    Pulmonary exam normal breath sounds clear to auscultation       Cardiovascular negative cardio ROS Normal cardiovascular exam Rhythm:Regular Rate:Normal     Neuro/Psych Anxiety negative neurological ROS     GI/Hepatic negative GI ROS, Neg liver ROS,   Endo/Other  Left breast Ca  Renal/GU negative Renal ROS     Musculoskeletal  (+) Arthritis , Osteoarthritis,    Abdominal   Peds  Hematology negative hematology ROS (+)   Anesthesia Other Findings   Reproductive/Obstetrics                            Anesthesia Physical Anesthesia Plan  ASA: II  Anesthesia Plan: General   Post-op Pain Management:  Regional for Post-op pain   Induction: Intravenous  PONV Risk Score and Plan: 4 or greater and Midazolam and Treatment may vary due to age or medical condition  Airway Management Planned: LMA  Additional Equipment:   Intra-op Plan:   Post-operative Plan: Extubation in OR  Informed Consent: I have reviewed the patients History and Physical, chart, labs and discussed the procedure including the risks, benefits and alternatives for the proposed anesthesia with the patient or authorized representative who has indicated his/her understanding and acceptance.     Dental advisory given  Plan Discussed with: CRNA and Anesthesiologist  Anesthesia Plan Comments:        Anesthesia Quick Evaluation

## 2021-01-28 NOTE — Progress Notes (Signed)
Assisted Dr. Royce Macadamia with left, ultrasound guided, pectoralis block. Side rails up, monitors on throughout procedure. See vital signs in flow sheet. Tolerated Procedure well.

## 2021-01-28 NOTE — Transfer of Care (Signed)
Immediate Anesthesia Transfer of Care Note  Patient: Cindy [name]  Procedure(s) Performed: LEFT BREAST LUMPECTOMY WITH RADIOACTIVE SEED AND LEFT AXILLARY SENTINEL LYMPH NODE BIOPSY (Left Breast)  Patient Location: PACU  Anesthesia Type:General  Level of Consciousness: awake  Airway & Oxygen Therapy: Patient Spontanous Breathing and Patient connected to face mask oxygen  Post-op Assessment: Report given to RN and Post -op Vital signs reviewed and stable  Post vital signs: Reviewed and stable  Last Vitals:  Vitals Value Taken Time  BP    Temp    Pulse    Resp    SpO2      Last Pain:  Vitals:   01/28/21 0818  TempSrc: Oral  PainSc: 0-No pain      Patients Stated Pain Goal: 3 (62/26/33 3545)  Complications: No complications documented.

## 2021-01-28 NOTE — Interval H&P Note (Signed)
History and Physical Interval Note:  01/28/2021 9:31 AM  Cindy [name]  has presented today for surgery, with the diagnosis of LEFT BREAST CANCER.  The various methods of treatment have been discussed with the patient and family. After consideration of risks, benefits and other options for treatment, the patient has consented to  Procedure(s) with comments: LEFT BREAST LUMPECTOMY WITH RADIOACTIVE SEED AND LEFT AXILLARY SENTINEL LYMPH NODE BIOPSY (Left) - PEC BLOCK as a surgical intervention.  The patient's history has been reviewed, patient examined, no change in status, stable for surgery.  I have reviewed the patient's chart and labs.  Questions were answered to the patient's satisfaction.     Rolm Bookbinder

## 2021-01-28 NOTE — Anesthesia Procedure Notes (Signed)
Anesthesia Regional Block: Pectoralis block   Pre-Anesthetic Checklist: ,, timeout performed, Correct Patient, Correct Site, Correct Laterality, Correct Procedure, Correct Position, site marked, Risks and benefits discussed,  Surgical consent,  Pre-op evaluation,  At surgeon's request and post-op pain management  Laterality: Left  Prep: chloraprep       Needles:  Injection technique: Single-shot  Needle Type: Echogenic Stimulator Needle     Needle Length: 9cm  Needle Gauge: 21   Needle insertion depth: 8 cm   Additional Needles:   Procedures:,,,, ultrasound used (permanent image in chart),,,,  Narrative:  Start time: 01/28/2021 8:40 AM End time: 01/28/2021 8:45 AM Injection made incrementally with aspirations every 5 mL.  Performed by: Personally  Anesthesiologist: Josephine Igo, MD  Additional Notes: Timeout performed. Patient sedated. Relevant anatomy ID'd using [place]. Incremental 2-60ml injection of LA with frequent aspiration. Patient tolerated procedure well.        Left Pectoralis Block

## 2021-01-28 NOTE — Op Note (Signed)
Preoperative diagnosis: Clinical stage I left breast cancer Postoperative diagnosis: Same as above Procedure: 1.  Left breast radioactive seed guided lumpectomy 2.  Left axillary sentinel lymph node biopsy, deep Surgeon: Dr. Serita Grammes Anesthesia: General with a left pectoral block Estimated blood loss: 30 cc Specimens: 1.  Left deep axillary lymph nodes with highest count of 1798 2.  Left breast radioactive seed guided lumpectomy marked with paint containing seed and clip 3.  Additional left breast lateral, posterior and superior and medial margins marked short superior, long lateral, double deep Special count was correct completion Drains: None Disposition recovery stable condition  Indications:49 yof otherwise healthy noted a left uoq mass. c. she underwent mm that shows c density breasts. in uoq of left breast there is an 8 mm mass. on [place] there is a 11x7x8 mm mass. nodes are normal by [place].. 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    Procedure: After informed consent was obtained the patient first underwent a pectoral block.  She was administered Lymphoseek in the standard periareolar fashion.  She already had a radioactive seed placed and I had these mammograms available in the operating room.  She was given antibiotics.  SCDs were placed.  She was placed under general anesthesia without complication.  She was prepped and draped in the standard sterile surgical fashion.  A surgical timeout was then performed.  I did both the surgeries through a low axillary incision.  I infiltrated marcaine and made a curvilinear incision. I tunneled to the seed with the help of the lighted retractor.   I then dissected through the breast tissue and used the neoprobe to identify the seed.  I then removed the seed and the surrounding tissue in that attempt to get a clear margin.  Mammogram confirmed  removal of the radioactive seed as well as the clip.  I did take some additional margins as listed above.  The posterior margin is now the muscle.  I placed clips in the cavity.  I then entered into the axilla.  I removed what appeared to be several normal sized axillary lymph nodes.  The highest count is listed as above.  There was minimal background radioactivity.  I then obtained hemostasis.  I closed the axillary fascia with 2-0 Vicryl.  I also closed the breast tissue with 2-0 vicryl The skin was closed with 3-0 Vicryl and 4-0  Monocryl.  Glue and Steri-Strips were applied.  A binder was placed.  She tolerated this well was extubated and transferred to recovery stable.

## 2021-01-28 NOTE — Anesthesia Procedure Notes (Signed)
Procedure Name: LMA Insertion Date/Time: 01/28/2021 10:06 AM Performed by: Lieutenant Diego, CRNA Pre-anesthesia Checklist: Patient identified, Emergency Drugs available, Suction available and Patient being monitored Patient Re-evaluated:Patient Re-evaluated prior to induction Oxygen Delivery Method: Circle system utilized Preoxygenation: Pre-oxygenation with 100% oxygen Induction Type: IV induction Ventilation: Mask ventilation without difficulty LMA: LMA inserted LMA Size: 4.0 Number of attempts: 1 Placement Confirmation: positive ETCO2 and breath sounds checked- equal and bilateral Tube secured with: Tape Dental Injury: Teeth and Oropharynx as per pre-operative assessment

## 2021-01-28 NOTE — Discharge Instructions (Signed)
[facility] Office Phone Number [phone]  BREAST BIOPSY/ PARTIAL MASTECTOMY: POST OP INSTRUCTIONS Take 400 mg of ibuprofen every 8 hours or 650 mg tylenol every 6 hours for next 72 hours then as needed. Use ice several times daily also. Always review your discharge instruction sheet given to you by the facility where your surgery was performed.  IF YOU HAVE DISABILITY OR FAMILY LEAVE FORMS, YOU MUST BRING THEM TO THE OFFICE FOR PROCESSING.  DO NOT GIVE THEM TO YOUR DOCTOR.  1. A prescription for pain medication may be given to you upon discharge.  Take your pain medication as prescribed, if needed.  If narcotic pain medicine is not needed, then you may take acetaminophen (Tylenol), naprosyn (Alleve) or ibuprofen (Advil) as needed. 2. Take your usually prescribed medications unless otherwise directed 3. If you need a refill on your pain medication, please contact your pharmacy.  They will contact our office to request authorization.  Prescriptions will not be filled after 5pm or on week-ends. 4. You should eat very light the first 24 hours after surgery, such as soup, crackers, pudding, etc.  Resume your normal diet the day after surgery. 5. Most patients will experience some swelling and bruising in the breast.  Ice packs and a good support bra will help.  Wear the breast binder provided or a sports bra for 72 hours day and night.  After that wear a sports bra during the day until you return to the office. Swelling and bruising can take several days to resolve.  6. It is common to experience some constipation if taking pain medication after surgery.  Increasing fluid intake and taking a stool softener will usually help or prevent this problem from occurring.  A mild laxative (Milk of Magnesia or Miralax) should be taken according to package directions if there are no bowel movements after 48 hours. 7. Unless discharge instructions indicate otherwise, you may remove your bandages 48  hours after surgery and you may shower at that time.  You may have steri-strips (small skin tapes) in place directly over the incision.  These strips should be left on the skin for 7-10 days and will come off on their own.  If your surgeon used skin glue on the incision, you may shower in 24 hours.  The glue will flake off over the next 2-3 weeks.  Any sutures or staples will be removed at the office during your follow-up visit. 8. ACTIVITIES:  You may resume regular daily activities (gradually increasing) beginning the next day.  Wearing a good support bra or sports bra minimizes pain and swelling.  You may have sexual intercourse when it is comfortable. a. You may drive when you no longer are taking prescription pain medication, you can comfortably wear a seatbelt, and you can safely maneuver your car and apply brakes. b. RETURN TO WORK:  ______________________________________________________________________________________ 9. You should see your doctor in the office for a follow-up appointment approximately two weeks after your surgery.  Your doctor's nurse will typically make your follow-up appointment when she calls you with your pathology report.  Expect your pathology report 3-4 business days after your surgery.  You may call to check if you do not hear from us after three days. 10. OTHER INSTRUCTIONS: _______________________________________________________________________________________________ _____________________________________________________________________________________________________________________________________ _____________________________________________________________________________________________________________________________________ _____________________________________________________________________________________________________________________________________  WHEN TO CALL DR WAKEFIELD: 1. Fever over 101.0 2. Nausea and/or vomiting. 3. Extreme swelling or  bruising. 4. Continued bleeding from incision. 5. Increased pain, redness, or drainage from the incision.  The clinic   staff is available to answer your questions during regular business hours.  Please don't hesitate to call and ask to speak to one of the nurses for clinical concerns.  If you have a medical emergency, go to the nearest emergency room or call 911.  A surgeon from [facility] is always on call at the hospital.  For further questions, please visit [email or web address] May take Tylenol after 2:20pm, if needed. May take Ibuprofen after 4pm, if needed.    Post Anesthesia Home Care Instructions  Activity: Get plenty of rest for the remainder of the day. A responsible individual must stay with you for 24 hours following the procedure.  For the next 24 hours, DO NOT: -Drive a car -Paediatric nurse -Drink alcoholic beverages -Take any medication unless instructed by your physician -Make any legal decisions or sign important papers.  Meals: Start with liquid foods such as gelatin or soup. Progress to regular foods as tolerated. Avoid greasy, spicy, heavy foods. If nausea and/or vomiting occur, drink only clear liquids until the nausea and/or vomiting subsides. Call your physician if vomiting continues.  Special Instructions/Symptoms: Your throat may feel dry or sore from the anesthesia or the breathing tube placed in your throat during surgery. If this causes discomfort, gargle with warm salt water. The discomfort should disappear within 24 hours.  If you had a scopolamine patch placed behind your ear for the management of post- operative nausea and/or vomiting:  1. The medication in the patch is effective for 72 hours, after which it should be removed.  Wrap patch in a tissue and discard in the trash. Wash hands thoroughly with soap and water. 2. You may remove the patch earlier than 72 hours if you experience unpleasant side effects which may include dry  mouth, dizziness or visual disturbances. 3. Avoid touching the patch. Wash your hands with soap and water after contact with the patch.

## 2021-01-28 NOTE — H&P (Signed)
50 yof otherwise healthy noted a left uoq mass. she has no prior breast history. no fh of breast cancer/ovarian. she has no nipple dc. she underwent mm that shows c density breasts. in uoq of left breast there is an 8 mm mass. on [place] there is a 11x7x8 mm mass. nodes are normal by [place].. 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 she is here with her husband to discuss options she has some limited rom of her left shoulder  Past Surgical History Andreas Blower, [place]; 01/16/2021 10:12 AM) No pertinent past surgical history   Diagnostic Studies History Andreas Blower, [place]; 01/16/2021 10:12 AM) Colonoscopy  never Mammogram  within last year Pap Smear  1-5 years ago  Allergies Andreas Blower, CMA; 01/16/2021 10:13 AM) No Known Allergies  [01/16/2021]:  Medication History Andreas Blower, [place]; 01/16/2021 10:13 AM) No Current Medications Medications Reconciled  Social History Andreas Blower, CMA; 01/16/2021 10:12 AM) Alcohol use  Recently quit alcohol use. Caffeine use  Carbonated beverages, Coffee. Tobacco use  Never smoker.  Family History Andreas Blower, [place]; 01/16/2021 10:12 AM) Cancer  Family Members In General, Father. Depression  Mother. Diabetes Mellitus  Brother. Melanoma  Sister. Migraine Headache  Father.  Pregnancy / Birth History Andreas Blower, [place]; 01/16/2021 10:12 AM) Age at menarche  37 years. Contraceptive History  Oral contraceptives. Gravida  2 Irregular periods  Length (months) of breastfeeding  >24 Maternal age  27-30 Para  1  Other Problems (Andreas Blower, [place]; 01/16/2021 10:12 AM) Alcohol Abuse  Anxiety Disorder  Back Pain  Breast Cancer  Lump In Breast   Review of Systems (Armen Ferguson CMA; 01/16/2021 10:12 AM) General Present- Fatigue. Not Present- Appetite Loss, Chills, Fever, Night Sweats, Weight Gain and Weight Loss. Skin Not Present- Change in Wart/Mole, Dryness, Hives,  Jaundice, New Lesions, Non-Healing Wounds, Rash and Ulcer. HEENT Present- Seasonal Allergies and Wears glasses/contact lenses. Not Present- Earache, Hearing Loss, Hoarseness, Nose Bleed, Oral Ulcers, Ringing in the Ears, Sinus Pain, Sore Throat, Visual Disturbances and Yellow Eyes. Respiratory Not Present- Bloody sputum, Chronic Cough, Difficulty Breathing, Snoring and Wheezing. Breast Present- Breast Pain. Not Present- Breast Mass, Nipple Discharge and Skin Changes. Cardiovascular Present- Palpitations. Not Present- Chest Pain, Difficulty Breathing Lying Down, Leg Cramps, Rapid Heart Rate, Shortness of Breath and Swelling of Extremities. Gastrointestinal Not Present- Abdominal Pain, Bloating, Bloody Stool, Change in Bowel Habits, Chronic diarrhea, Constipation, Difficulty Swallowing, Excessive gas, Gets full quickly at meals, Hemorrhoids, Indigestion, Nausea, Rectal Pain and Vomiting. Female Genitourinary Present- Frequency. Not Present- Nocturia, Painful Urination, Pelvic Pain and Urgency. Musculoskeletal Present- Joint Pain and Joint Stiffness. Not Present- Back Pain, Muscle Pain, Muscle Weakness and Swelling of Extremities. Neurological Not Present- Decreased Memory, Fainting, Headaches, Numbness, Seizures, Tingling, Tremor, Trouble walking and Weakness. Psychiatric Present- Anxiety. Not Present- Bipolar, Change in Sleep Pattern, Depression, Fearful and Frequent crying. Endocrine Not Present- Cold Intolerance, Excessive Hunger, Hair Changes, Heat Intolerance, Hot flashes and New Diabetes. Hematology Not Present- Blood Thinners, Easy Bruising, Excessive bleeding, Gland problems, HIV and Persistent Infections.  Vitals (Armen Ferguson CMA; 01/16/2021 10:13 AM) 01/16/2021 10:12 AM Weight: 156.25 lb Height: 62in Body Surface Area: 1.72 m Body Mass Index: 28.58 kg/m  Temp.: 98.54F  Pulse: 95 (Regular)  P.OX: 99% (Room air) BP: 132/88(Sitting, Left Arm, Standard)  Physical Exam Rolm Bookbinder MD; 01/16/2021 10:21 AM) General Mental Status-Alert. Orientation-Oriented X3. Breast Nipples-No Discharge. Note: no right breast mass very uoq left  breast small 1 cm mobile mass, some associated hematoma now also Lymphatic Head & Neck General Head & Neck Lymphatics: Bilateral - Description - Normal. Axillary General Axillary Region: Bilateral - Description - Normal. Note: no [place] adenopathy   Assessment & Plan Rolm Bookbinder MD; 01/16/2021 10:53 AM) BREAST CANCER OF UPPER-OUTER QUADRANT OF LEFT FEMALE BREAST (C50.412) Story: Left breast seed guided lumpectomy, left axillary sn biopsy We discussed the staging and pathophysiology of breast cancer. We discussed all of the different options for treatment for breast cancer including surgery, chemotherapy, radiation therapy, and antiestrogen therapy. We discussed a sentinel lymph node biopsy as she does not appear to having lymph node involvement right now. We discussed the performance of that with injection of radioactive tracer. We discussed that there is a chance of having a positive node with a sentinel lymph node biopsy and we will await the permanent pathology to make any other first further decisions in terms of her treatment. We discussed up to a 5% risk lifetime of chronic shoulder pain as well as lymphedema associated with a sentinel lymph node biopsy. I will have her see PT now for rom of shoulder as well as [place]. We discussed the options for treatment of the breast cancer which included lumpectomy versus a mastectomy. We discussed the performance of the lumpectomy with radioactive seed placement. We discussed a 5-10% chance of a positive margin requiring reexcision in the operating room. We also discussed that she will need radiation therapy if she undergoes lumpectomy. We discussed mastectomy and the postoperative care for that as well. Mastectomy can be followed by reconstruction. The decision for lumpectomy vs  mastectomy has no impact on decision for chemotherapy. We discussed oncotype testing on surgical specimen. Most mastectomy patients will not need radiation therapy. We discussed that there is no difference in her survival whether she undergoes lumpectomy with radiation therapy or antiestrogen therapy versus a mastectomy. There is also no real difference between her recurrence in the breast. We discussed the risks of operation including bleeding, infection, possible reoperation. She understands her further therapy will be based on what her stages at the time of her operation. will schedule soon

## 2021-01-29 ENCOUNTER — Encounter (HOSPITAL_BASED_OUTPATIENT_CLINIC_OR_DEPARTMENT_OTHER): Payer: Self-pay | Admitting: General Surgery

## 2021-01-29 ENCOUNTER — Telehealth: Payer: Self-pay | Admitting: *Deleted

## 2021-01-29 NOTE — Telephone Encounter (Signed)
Pt called to discuss post op instructions. Pt wanted to ensure she was correctly following. Related doing well but had nausea and vomiting last night d/t n/v. Discussed small bland meals. Received verbal understanding. Denies further needs or concerns at this time.

## 2021-01-30 LAB — SURGICAL PATHOLOGY

## 2021-02-03 ENCOUNTER — Telehealth: Payer: Self-pay | Admitting: *Deleted

## 2021-02-03 ENCOUNTER — Encounter: Payer: Self-pay | Admitting: *Deleted

## 2021-02-03 NOTE — Telephone Encounter (Signed)
Received order for oncotype testing. Requisition faxed to pathology and [facility] °

## 2021-02-11 ENCOUNTER — Other Ambulatory Visit: Payer: Self-pay

## 2021-02-11 ENCOUNTER — Encounter: Payer: Self-pay | Admitting: Physical Therapy

## 2021-02-11 ENCOUNTER — Ambulatory Visit: Payer: Managed Care, Other (non HMO) | Attending: General Surgery | Admitting: Physical Therapy

## 2021-02-11 DIAGNOSIS — Z483 Aftercare following surgery for neoplasm: Secondary | ICD-10-CM | POA: Insufficient documentation

## 2021-02-11 DIAGNOSIS — M25612 Stiffness of left shoulder, not elsewhere classified: Secondary | ICD-10-CM | POA: Insufficient documentation

## 2021-02-11 DIAGNOSIS — Z170 Estrogen receptor positive status [ER+]: Secondary | ICD-10-CM | POA: Insufficient documentation

## 2021-02-11 DIAGNOSIS — G8929 Other chronic pain: Secondary | ICD-10-CM | POA: Insufficient documentation

## 2021-02-11 DIAGNOSIS — R600 Localized edema: Secondary | ICD-10-CM | POA: Insufficient documentation

## 2021-02-11 DIAGNOSIS — C50412 Malignant neoplasm of upper-outer quadrant of left female breast: Secondary | ICD-10-CM | POA: Insufficient documentation

## 2021-02-11 DIAGNOSIS — M25512 Pain in left shoulder: Secondary | ICD-10-CM | POA: Insufficient documentation

## 2021-02-11 DIAGNOSIS — R293 Abnormal posture: Secondary | ICD-10-CM | POA: Diagnosis present
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
## 2021-02-12 ENCOUNTER — Telehealth: Payer: Self-pay | Admitting: *Deleted

## 2021-02-12 ENCOUNTER — Encounter: Payer: Self-pay | Admitting: *Deleted

## 2021-02-12 DIAGNOSIS — Z170 Estrogen receptor positive status [ER+]: Secondary | ICD-10-CM

## 2021-02-12 NOTE — Telephone Encounter (Signed)
Received oncotype score of 23. Physician team notified. Called pt with results. Discussed chemo not recommended based on these results. Informed next step is xrt with Dr. Sondra Come. Referral placed. Informed may resume PT.

## 2021-02-16 ENCOUNTER — Other Ambulatory Visit: Payer: Self-pay

## 2021-02-16 ENCOUNTER — Encounter: Payer: Self-pay | Admitting: Physical Therapy

## 2021-02-16 ENCOUNTER — Ambulatory Visit: Payer: Managed Care, Other (non HMO) | Admitting: Physical Therapy

## 2021-02-16 DIAGNOSIS — M25612 Stiffness of left shoulder, not elsewhere classified: Secondary | ICD-10-CM | POA: Diagnosis not present

## 2021-02-16 DIAGNOSIS — Z483 Aftercare following surgery for neoplasm: Secondary | ICD-10-CM

## 2021-02-16 DIAGNOSIS — G8929 Other chronic pain: Secondary | ICD-10-CM
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
## 2021-02-17 ENCOUNTER — Encounter: Payer: Self-pay | Admitting: *Deleted

## 2021-02-19 ENCOUNTER — Ambulatory Visit: Payer: Managed Care, Other (non HMO) | Admitting: Rehabilitation

## 2021-02-19 ENCOUNTER — Other Ambulatory Visit: Payer: Self-pay

## 2021-02-19 ENCOUNTER — Encounter: Payer: Self-pay | Admitting: Rehabilitation

## 2021-02-19 DIAGNOSIS — M25612 Stiffness of left shoulder, not elsewhere classified: Secondary | ICD-10-CM

## 2021-02-19 DIAGNOSIS — M25512 Pain in left shoulder: Secondary | ICD-10-CM

## 2021-02-19 DIAGNOSIS — G8929 Other chronic pain: Secondary | ICD-10-CM

## 2021-02-19 DIAGNOSIS — R600 Localized edema: Secondary | ICD-10-CM

## 2021-02-19 DIAGNOSIS — Z483 Aftercare following surgery for neoplasm: Secondary | ICD-10-CM

## 2021-02-19 DIAGNOSIS — Z170 Estrogen receptor positive status [ER+]: Secondary | ICD-10-CM

## 2021-02-19 DIAGNOSIS — R293 Abnormal posture: Secondary | ICD-10-CM

## 2021-02-19 DIAGNOSIS — C50412 Malignant neoplasm of upper-outer quadrant of left female breast: Secondary | ICD-10-CM
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
## 2021-02-20 ENCOUNTER — Encounter: Payer: Self-pay | Admitting: Hematology

## 2021-02-23 ENCOUNTER — Encounter: Payer: Self-pay | Admitting: Physical Therapy

## 2021-02-23 ENCOUNTER — Other Ambulatory Visit: Payer: Self-pay

## 2021-02-23 ENCOUNTER — Ambulatory Visit: Payer: Managed Care, Other (non HMO) | Admitting: Physical Therapy

## 2021-02-23 DIAGNOSIS — M25612 Stiffness of left shoulder, not elsewhere classified: Secondary | ICD-10-CM

## 2021-02-23 DIAGNOSIS — G8929 Other chronic pain: Secondary | ICD-10-CM
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
## 2021-02-23 NOTE — Progress Notes (Addendum)
Location of Breast Cancer: left breast FOLLOW UP NEW   Histology per Pathology Report:  2nd biopsy 01/28/2021   Molecular Studies 01/28/2021   01/01/2021     Receptor Status: ER(+), PR (+), Her2-neu (+), Ki-67(20%)   Did patient present with symptoms (if so, please note symptoms) or was this found on screening mammography?: Screening that resulted in a recall   Past/Anticipated interventions by surgeon, if any: Biopsy 01/01/2021; repeat biospy with molecular studies 01/28/2021   Past/Anticipated interventions by medical oncology, if any: saw Dr. Burr Medico 01/21/2021; was pending molecular studies.  Plans to follow up after that or radiation.     Lymphedema issues, if any: no   Pain issues, if any: no   SAFETY ISSUES:  Prior radiation? no  Pacemaker/ICD? no  Possible current pregnancy? no  Is the patient on methotrexate? no   Current Complaints / other details:  Husband Shanon Brow is present with her at this visit today.  He had one grown son.  Patient reports having swelling to the breast and hard knots in the breast/incisional area.  She also has some difficulty with range of motion on the left side.    Vitals:   02/26/21 1348  BP: 130/87  Pulse: (!) 103  Resp: 18  Temp: 98 F (36.7 C)  SpO2: 100%  Weight: 151 lb 6.4 oz (68.7 kg)

## 2021-02-26 ENCOUNTER — Ambulatory Visit
Admission: RE | Admit: 2021-02-26 | Discharge: 2021-02-26 | Disposition: A | Discharge status: Home / Self Care | Payer: Managed Care, Other (non HMO) | Source: Ambulatory Visit | Attending: Radiation Oncology | Admitting: Radiation Oncology

## 2021-02-26 ENCOUNTER — Encounter: Payer: Self-pay | Admitting: Rehabilitation

## 2021-02-26 ENCOUNTER — Encounter: Payer: Self-pay | Admitting: Radiation Oncology

## 2021-02-26 ENCOUNTER — Other Ambulatory Visit: Payer: Self-pay

## 2021-02-26 ENCOUNTER — Ambulatory Visit: Payer: Managed Care, Other (non HMO) | Admitting: Rehabilitation

## 2021-02-26 DIAGNOSIS — R600 Localized edema: Secondary | ICD-10-CM | POA: Insufficient documentation

## 2021-02-26 DIAGNOSIS — C50412 Malignant neoplasm of upper-outer quadrant of left female breast: Secondary | ICD-10-CM

## 2021-02-26 DIAGNOSIS — M25612 Stiffness of left shoulder, not elsewhere classified: Secondary | ICD-10-CM

## 2021-02-26 DIAGNOSIS — Z170 Estrogen receptor positive status [ER+]: Secondary | ICD-10-CM

## 2021-02-26 DIAGNOSIS — M25512 Pain in left shoulder: Secondary | ICD-10-CM | POA: Insufficient documentation

## 2021-02-26 DIAGNOSIS — R293 Abnormal posture: Secondary | ICD-10-CM | POA: Insufficient documentation

## 2021-02-26 DIAGNOSIS — Z483 Aftercare following surgery for neoplasm: Secondary | ICD-10-CM | POA: Insufficient documentation

## 2021-02-26 DIAGNOSIS — Z510 Encounter for antineoplastic radiation therapy: Secondary | ICD-10-CM | POA: Diagnosis not present

## 2021-02-26 DIAGNOSIS — G8929 Other chronic pain: Secondary | ICD-10-CM | POA: Insufficient documentation
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
## 2021-02-26 NOTE — Progress Notes (Signed)
Radiation Oncology         [phone] ________________________________  Name: Cindy [name] MRN: [id number]  Date: 02/26/2021  DOB: May 19, 1971  Re-Evaluation Note  CC: Lorrene Reid, Joline Maxcy, MD    ICD-10-CM   1. Malignant neoplasm of upper-outer quadrant of left breast in female, estrogen receptor positive ([place])  C50.412 Ambulatory referral to Social Work   Z17.0     Diagnosis: Stage IA (pT1c, pN0) Left Breast UOQ, Invasive Ductal Carcinoma with intermediate grade DCIS, ER+ / PR+ / Her2-, Grade 2  Narrative:  The patient returns today to discuss radiation treatment options. She was seen in consultation on 01/20/2021, at which time it was recommended that she proceed with lumpectomy and sentinel lymph node biopsy, oncotype Dx to determine the potential benefit of adjuvant chemotherapy, adjuvant radiation therapy, and adjuvant hormonal therapy pending medical oncology consultation.  She underwent a left breast lumpectomy with left axillary sentinel lymph node biopsy on 01/28/2021 under the care of Dr. Donne Hazel. Pathology from the procedure revealed grade 2 invasive ductal carcinoma with intermediate grade DCIS. There were also noted to be calcifications associated with the carcinoma. Margins were uninvolved by carcinoma. Three left axillary sentinel lymph nodes were biopsied, all of which were negative for carcinoma.   Oncotype DX was obtained on the final surgical sample and the recurrence score of 23 predicted a risk of recurrence outside the breast over the next 9 years of 9%, if the patient's only systemic therapy was an antiestrogen for 5 years. It also predicted no significant benefit from chemotherapy.  On review of systems, the patient reports having significant nerve type pain in the axillary region soon after surgery but overall this problem is improving.  She denies any nipple discharge or drainage.  She denies any chills or fever or significant swelling in her left arm..     Allergies:  is allergic to latex.  Meds: Current Outpatient Medications  Medication Sig Dispense Refill  . oxyCODONE (OXY IR/ROXICODONE) 5 MG immediate release tablet Take 1 tablet (5 mg total) by mouth every 6 (six) hours as needed for severe pain. (Patient not taking: Reported on 02/26/2021) 10 tablet 0   No current facility-administered medications for this encounter.    Physical Findings: The patient is in no acute distress. Patient is alert and oriented.  weight is 151 lb 6.4 oz (68.7 kg). Her temperature is 98 F (36.7 C). Her blood pressure is 130/87 and her pulse is 103 (abnormal). Her respiration is 18 and oxygen saturation is 100%.  No significant changes. Lungs are clear to auscultation bilaterally. Heart has regular rate and rhythm. No palpable cervical, supraclavicular, or axillary adenopathy. Abdomen soft, non-tender, normal bowel sounds. Right breast: no palpable mass, nipple discharge or bleeding. Left breast: Well healed scar in the upper outer quadrant which encompasses the lumpectomy site as well as the sentinel node procedure.  Some induration inferior to her scar but no obvious significant palpable seroma. she has some limitation of movement in her left arm and shoulder but does appear that she would be able to position her arm in the treatment position later today.  Lab Findings: Lab Results  Component Value Date   WBC 4.6 07/03/2019   HGB 14.1 07/03/2019   HCT 41.1 07/03/2019   MCV 94 07/03/2019   PLT 317 07/03/2019    Radiographic Findings: NM Sentinel Node Inj-No Rpt (Breast)  Result Date: 01/28/2021 Sulfur colloid was injected by the nuclear medicine technologist for melanoma sentinel node.  MM Breast Surgical Specimen  Result Date: 01/28/2021 CLINICAL DATA:  Evaluate specimen EXAM: SPECIMEN RADIOGRAPH OF THE LEFT BREAST COMPARISON:  Previous exam(s). FINDINGS: Status post excision of the left breast. The radioactive seed and biopsy marker clip are  present, completely intact, and were marked for pathology. IMPRESSION: Specimen radiograph of the left breast. Electronically Signed   By: Dorise Bullion III M.D   On: 01/28/2021 10:47    Impression: Stage IA (pT1c, pN0) Left Breast UOQ, Invasive Ductal Carcinoma with intermediate grade DCIS, ER+ / PR+ / Her2-, Grade 2  The patient would be a good candidate for radiation therapy as breast conserving therapy.  I discussed the overall treatment course side effects and potential toxicities of radiation therapy in this situation with the patient and her husband.  She appears to understand and wishes to proceed with planned course of treatment.  She would appear to be a candidate for hypofractionated accelerated radiation therapy over approximately 4 weeks.  She does have a large breast size but would appear to be still a candidate for this shorter course of treatment.  Will use cardiac sparing techniques if necessary.  Plan:  Patient is scheduled for CT simulation later today.  She will begin her radiation therapy March 14.  She will see Dr. Donne Hazel on March 10 or 11 for follow-up.  Total time spent in this encounter was 35 minutes which included reviewing the patient's most recent lumpectomy, pathology report, oncoptype DX, physical examination, and documentation.  -----------------------------------  Blair Promise, PhD, MD  This document serves as a record of services personally performed by Gery Pray, MD. It was created on his behalf by Clerance Lav, a trained medical scribe. The creation of this record is based on the scribe's personal observations and the provider's statements to them. This document has been checked and approved by the attending provider.

## 2021-02-27 ENCOUNTER — Encounter: Payer: Self-pay | Admitting: General Practice

## 2021-02-27 NOTE — Progress Notes (Signed)
[facility] Psychosocial Distress Screening Clinical Social Work  Clinical Social Work was referred by distress screening protocol.  The patient scored a 5 on the Psychosocial Distress Thermometer which indicates moderate distress. Clinical Social Worker contacted patient by phone to assess for distress and other psychosocial needs. "Im getting there, Im doing OK, Im doing better than last time we talked."  Is grateful that treatment will be starting, can focus on getting through radiation and taking care of herself.  Works at home and has flexible hours.  Husband supportive.  Emailed information on [employer] [place] and [employer] for financial help.  Referred for [employer].  CSW and patient discussed common feeling and emotions when being diagnosed with cancer, and the importance of support during treatment. CSW informed patient of the support team and support services at [facility]. CSW provided contact information and encouraged patient to call with any questions or concerns.    ON[facility] DISTRESS SCREENING 02/26/2021  Screening Type Initial Screening  Distress experienced in past week (1-10) 5  Emotional problem type Nervousness/Anxiety;Adjusting to illness  Physical Problem type   Physician notified of physical symptoms Yes  Referral to clinical psychology No  Referral to clinical social work No  Referral to dietition No  Referral to financial advocate No  Referral to support programs No  Referral to palliative care No    Clinical Social Worker follow up needed: No.  If yes, follow up plan:  Beverely Pace, [place], LCSW Clinical Social Worker Phone:  [phone]

## 2021-03-02 ENCOUNTER — Ambulatory Visit: Payer: Managed Care, Other (non HMO) | Admitting: Physical Therapy

## 2021-03-02 ENCOUNTER — Encounter: Payer: Self-pay | Admitting: Physical Therapy

## 2021-03-02 ENCOUNTER — Other Ambulatory Visit: Payer: Self-pay

## 2021-03-02 DIAGNOSIS — G8929 Other chronic pain: Secondary | ICD-10-CM

## 2021-03-02 DIAGNOSIS — M25512 Pain in left shoulder: Secondary | ICD-10-CM

## 2021-03-02 DIAGNOSIS — C50412 Malignant neoplasm of upper-outer quadrant of left female breast: Secondary | ICD-10-CM | POA: Diagnosis not present

## 2021-03-02 DIAGNOSIS — M25612 Stiffness of left shoulder, not elsewhere classified: Secondary | ICD-10-CM

## 2021-03-02 DIAGNOSIS — Z483 Aftercare following surgery for neoplasm: Secondary | ICD-10-CM
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
## 2021-03-03 DIAGNOSIS — C50412 Malignant neoplasm of upper-outer quadrant of left female breast: Secondary | ICD-10-CM | POA: Diagnosis not present

## 2021-03-04 ENCOUNTER — Encounter: Payer: Self-pay | Admitting: *Deleted

## 2021-03-05 ENCOUNTER — Encounter: Payer: Self-pay | Admitting: Rehabilitation

## 2021-03-05 ENCOUNTER — Telehealth: Payer: Self-pay | Admitting: Hematology

## 2021-03-05 ENCOUNTER — Ambulatory Visit: Payer: Managed Care, Other (non HMO) | Admitting: Radiation Oncology

## 2021-03-05 ENCOUNTER — Ambulatory Visit: Payer: Managed Care, Other (non HMO) | Admitting: Rehabilitation

## 2021-03-05 ENCOUNTER — Other Ambulatory Visit: Payer: Self-pay

## 2021-03-05 DIAGNOSIS — R600 Localized edema: Secondary | ICD-10-CM

## 2021-03-05 DIAGNOSIS — Z483 Aftercare following surgery for neoplasm: Secondary | ICD-10-CM

## 2021-03-05 DIAGNOSIS — M25612 Stiffness of left shoulder, not elsewhere classified: Secondary | ICD-10-CM

## 2021-03-05 DIAGNOSIS — G8929 Other chronic pain: Secondary | ICD-10-CM

## 2021-03-05 DIAGNOSIS — C50412 Malignant neoplasm of upper-outer quadrant of left female breast: Secondary | ICD-10-CM | POA: Diagnosis not present

## 2021-03-05 DIAGNOSIS — Z170 Estrogen receptor positive status [ER+]: Secondary | ICD-10-CM

## 2021-03-05 DIAGNOSIS — M25512 Pain in left shoulder: Secondary | ICD-10-CM

## 2021-03-05 DIAGNOSIS — R293 Abnormal posture: Secondary | ICD-10-CM

## 2021-03-05 NOTE — Patient Instructions (Signed)
Access Code: [email or web address]: [email or web address]: 03/10/2022Prepared by: Marcene Brawn TevisExercises  Standing Bilateral Low Shoulder Row with Anchored Resistance - 1 x daily - 3 x weekly - 1-3 sets - 10 reps - no hold  Single Arm Shoulder Extension with Anchored Resistance - 1 x daily - 3 x weekly - 1-3 sets - 10 reps - no hold  Shoulder External Rotation with Anchored Resistance - 1 x daily - 3 x weekly - 1-3 sets - 10 reps - no hold  Standing Single Arm Shoulder Flexion with Posterior Anchored Resistance - 1 x daily - 3 x weekly - 1-3 sets - 10 reps - no hold
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
## 2021-03-05 NOTE — Telephone Encounter (Signed)
Scheduled appt per 3/9 sch msg. Called pt no answer. Left vm with appt date and time.

## 2021-03-06 ENCOUNTER — Ambulatory Visit: Payer: Managed Care, Other (non HMO)

## 2021-03-09 ENCOUNTER — Encounter: Payer: Managed Care, Other (non HMO) | Admitting: Physical Therapy

## 2021-03-09 ENCOUNTER — Ambulatory Visit
Admission: RE | Admit: 2021-03-09 | Discharge: 2021-03-09 | Disposition: A | Discharge status: Home / Self Care | Payer: Managed Care, Other (non HMO) | Source: Ambulatory Visit | Attending: Radiation Oncology | Admitting: Radiation Oncology

## 2021-03-09 ENCOUNTER — Other Ambulatory Visit: Payer: Self-pay

## 2021-03-09 DIAGNOSIS — C50412 Malignant neoplasm of upper-outer quadrant of left female breast: Secondary | ICD-10-CM | POA: Diagnosis not present

## 2021-03-09 DIAGNOSIS — Z170 Estrogen receptor positive status [ER+]: Secondary | ICD-10-CM

## 2021-03-10 ENCOUNTER — Ambulatory Visit
Admission: RE | Admit: 2021-03-10 | Discharge: 2021-03-10 | Disposition: A | Discharge status: Home / Self Care | Payer: Managed Care, Other (non HMO) | Source: Ambulatory Visit | Attending: Radiation Oncology | Admitting: Radiation Oncology

## 2021-03-10 DIAGNOSIS — C50412 Malignant neoplasm of upper-outer quadrant of left female breast: Secondary | ICD-10-CM | POA: Diagnosis not present

## 2021-03-10 MED ORDER — ALRA NON-METALLIC DEODORANT (RAD-ONC)
1.0000 "application " | Freq: Once | TOPICAL | Status: AC
  Administered 2021-03-10: 1 via TOPICAL

## 2021-03-10 MED ORDER — RADIAPLEXRX EX GEL: Freq: Once | CUTANEOUS | Status: AC

## 2021-03-11 ENCOUNTER — Encounter: Payer: Managed Care, Other (non HMO) | Admitting: Physical Therapy

## 2021-03-11 ENCOUNTER — Ambulatory Visit
Admission: RE | Admit: 2021-03-11 | Discharge: 2021-03-11 | Disposition: A | Discharge status: Home / Self Care | Payer: Managed Care, Other (non HMO) | Source: Ambulatory Visit | Attending: Radiation Oncology | Admitting: Radiation Oncology

## 2021-03-11 DIAGNOSIS — C50412 Malignant neoplasm of upper-outer quadrant of left female breast: Secondary | ICD-10-CM | POA: Diagnosis not present

## 2021-03-12 ENCOUNTER — Other Ambulatory Visit: Payer: Self-pay

## 2021-03-12 ENCOUNTER — Ambulatory Visit
Admission: RE | Admit: 2021-03-12 | Discharge: 2021-03-12 | Disposition: A | Discharge status: Home / Self Care | Payer: Managed Care, Other (non HMO) | Source: Ambulatory Visit | Attending: Radiation Oncology | Admitting: Radiation Oncology

## 2021-03-12 DIAGNOSIS — C50412 Malignant neoplasm of upper-outer quadrant of left female breast: Secondary | ICD-10-CM | POA: Diagnosis not present

## 2021-03-13 ENCOUNTER — Other Ambulatory Visit: Payer: Self-pay

## 2021-03-13 ENCOUNTER — Ambulatory Visit
Admission: RE | Admit: 2021-03-13 | Discharge: 2021-03-13 | Disposition: A | Discharge status: Home / Self Care | Payer: Managed Care, Other (non HMO) | Source: Ambulatory Visit | Attending: Radiation Oncology | Admitting: Radiation Oncology

## 2021-03-13 DIAGNOSIS — C50412 Malignant neoplasm of upper-outer quadrant of left female breast: Secondary | ICD-10-CM | POA: Diagnosis not present

## 2021-03-16 ENCOUNTER — Ambulatory Visit
Admission: RE | Admit: 2021-03-16 | Discharge: 2021-03-16 | Disposition: A | Discharge status: Home / Self Care | Payer: Managed Care, Other (non HMO) | Source: Ambulatory Visit | Attending: Radiation Oncology | Admitting: Radiation Oncology

## 2021-03-16 ENCOUNTER — Other Ambulatory Visit: Payer: Self-pay

## 2021-03-16 DIAGNOSIS — C50412 Malignant neoplasm of upper-outer quadrant of left female breast: Secondary | ICD-10-CM | POA: Diagnosis not present

## 2021-03-17 ENCOUNTER — Ambulatory Visit: Payer: Managed Care, Other (non HMO)

## 2021-03-17 DIAGNOSIS — C50412 Malignant neoplasm of upper-outer quadrant of left female breast: Secondary | ICD-10-CM | POA: Diagnosis not present

## 2021-03-18 ENCOUNTER — Other Ambulatory Visit: Payer: Self-pay

## 2021-03-18 ENCOUNTER — Ambulatory Visit
Admission: RE | Admit: 2021-03-18 | Discharge: 2021-03-18 | Disposition: A | Discharge status: Home / Self Care | Payer: Managed Care, Other (non HMO) | Source: Ambulatory Visit | Attending: Radiation Oncology | Admitting: Radiation Oncology

## 2021-03-18 DIAGNOSIS — C50412 Malignant neoplasm of upper-outer quadrant of left female breast: Secondary | ICD-10-CM | POA: Diagnosis not present

## 2021-03-19 ENCOUNTER — Ambulatory Visit
Admission: RE | Admit: 2021-03-19 | Discharge: 2021-03-19 | Disposition: A | Discharge status: Home / Self Care | Payer: Managed Care, Other (non HMO) | Source: Ambulatory Visit | Attending: Radiation Oncology | Admitting: Radiation Oncology

## 2021-03-19 DIAGNOSIS — C50412 Malignant neoplasm of upper-outer quadrant of left female breast: Secondary | ICD-10-CM | POA: Diagnosis not present

## 2021-03-20 ENCOUNTER — Other Ambulatory Visit: Payer: Self-pay

## 2021-03-20 ENCOUNTER — Ambulatory Visit
Admission: RE | Admit: 2021-03-20 | Discharge: 2021-03-20 | Disposition: A | Discharge status: Home / Self Care | Payer: Managed Care, Other (non HMO) | Source: Ambulatory Visit | Attending: Radiation Oncology | Admitting: Radiation Oncology

## 2021-03-20 DIAGNOSIS — C50412 Malignant neoplasm of upper-outer quadrant of left female breast: Secondary | ICD-10-CM | POA: Diagnosis not present

## 2021-03-23 ENCOUNTER — Other Ambulatory Visit: Payer: Self-pay

## 2021-03-23 ENCOUNTER — Ambulatory Visit
Admission: RE | Admit: 2021-03-23 | Discharge: 2021-03-23 | Disposition: A | Discharge status: Home / Self Care | Payer: Managed Care, Other (non HMO) | Source: Ambulatory Visit | Attending: Radiation Oncology | Admitting: Radiation Oncology

## 2021-03-23 DIAGNOSIS — C50412 Malignant neoplasm of upper-outer quadrant of left female breast: Secondary | ICD-10-CM | POA: Diagnosis not present

## 2021-03-24 ENCOUNTER — Ambulatory Visit: Payer: Managed Care, Other (non HMO) | Admitting: Radiation Oncology

## 2021-03-24 ENCOUNTER — Ambulatory Visit
Admission: RE | Admit: 2021-03-24 | Discharge: 2021-03-24 | Disposition: A | Discharge status: Home / Self Care | Payer: Managed Care, Other (non HMO) | Source: Ambulatory Visit | Attending: Radiation Oncology | Admitting: Radiation Oncology

## 2021-03-24 DIAGNOSIS — C50412 Malignant neoplasm of upper-outer quadrant of left female breast: Secondary | ICD-10-CM | POA: Diagnosis not present

## 2021-03-24 MED ORDER — RADIAPLEXRX EX GEL: Freq: Once | CUTANEOUS | Status: AC

## 2021-03-25 ENCOUNTER — Other Ambulatory Visit: Payer: Self-pay

## 2021-03-25 ENCOUNTER — Ambulatory Visit
Admission: RE | Admit: 2021-03-25 | Discharge: 2021-03-25 | Disposition: A | Discharge status: Home / Self Care | Payer: Managed Care, Other (non HMO) | Source: Ambulatory Visit | Attending: Radiation Oncology | Admitting: Radiation Oncology

## 2021-03-25 DIAGNOSIS — C50412 Malignant neoplasm of upper-outer quadrant of left female breast: Secondary | ICD-10-CM | POA: Diagnosis not present

## 2021-03-26 ENCOUNTER — Ambulatory Visit
Admission: RE | Admit: 2021-03-26 | Discharge: 2021-03-26 | Disposition: A | Discharge status: Home / Self Care | Payer: Managed Care, Other (non HMO) | Source: Ambulatory Visit | Attending: Radiation Oncology | Admitting: Radiation Oncology

## 2021-03-26 ENCOUNTER — Ambulatory Visit: Payer: Managed Care, Other (non HMO)

## 2021-03-26 DIAGNOSIS — C50412 Malignant neoplasm of upper-outer quadrant of left female breast: Secondary | ICD-10-CM | POA: Diagnosis not present

## 2021-03-27 ENCOUNTER — Other Ambulatory Visit: Payer: Self-pay

## 2021-03-27 ENCOUNTER — Ambulatory Visit: Payer: Managed Care, Other (non HMO)

## 2021-03-27 ENCOUNTER — Ambulatory Visit
Admission: RE | Admit: 2021-03-27 | Discharge: 2021-03-27 | Disposition: A | Discharge status: Home / Self Care | Payer: Managed Care, Other (non HMO) | Source: Ambulatory Visit | Attending: Radiation Oncology | Admitting: Radiation Oncology

## 2021-03-27 DIAGNOSIS — Z510 Encounter for antineoplastic radiation therapy: Secondary | ICD-10-CM | POA: Diagnosis not present

## 2021-03-27 DIAGNOSIS — Z170 Estrogen receptor positive status [ER+]: Secondary | ICD-10-CM | POA: Insufficient documentation

## 2021-03-27 DIAGNOSIS — C50412 Malignant neoplasm of upper-outer quadrant of left female breast: Secondary | ICD-10-CM | POA: Insufficient documentation

## 2021-03-27 DIAGNOSIS — Z79899 Other long term (current) drug therapy: Secondary | ICD-10-CM | POA: Diagnosis not present

## 2021-03-27 DIAGNOSIS — R5383 Other fatigue: Secondary | ICD-10-CM | POA: Diagnosis not present

## 2021-03-27 DIAGNOSIS — Z923 Personal history of irradiation: Secondary | ICD-10-CM | POA: Diagnosis not present

## 2021-03-27 DIAGNOSIS — Z79811 Long term (current) use of aromatase inhibitors: Secondary | ICD-10-CM | POA: Diagnosis not present

## 2021-03-27 DIAGNOSIS — Z171 Estrogen receptor negative status [ER-]: Secondary | ICD-10-CM | POA: Diagnosis not present

## 2021-03-30 ENCOUNTER — Other Ambulatory Visit: Payer: Self-pay

## 2021-03-30 ENCOUNTER — Ambulatory Visit
Admission: RE | Admit: 2021-03-30 | Discharge: 2021-03-30 | Disposition: A | Discharge status: Home / Self Care | Payer: Managed Care, Other (non HMO) | Source: Ambulatory Visit | Attending: Radiation Oncology | Admitting: Radiation Oncology

## 2021-03-30 ENCOUNTER — Ambulatory Visit: Payer: Managed Care, Other (non HMO)

## 2021-03-30 DIAGNOSIS — Z510 Encounter for antineoplastic radiation therapy: Secondary | ICD-10-CM | POA: Diagnosis not present

## 2021-03-31 ENCOUNTER — Ambulatory Visit: Payer: Managed Care, Other (non HMO)

## 2021-03-31 ENCOUNTER — Ambulatory Visit
Admission: RE | Admit: 2021-03-31 | Discharge: 2021-03-31 | Disposition: A | Discharge status: Home / Self Care | Payer: Managed Care, Other (non HMO) | Source: Ambulatory Visit | Attending: Radiation Oncology | Admitting: Radiation Oncology

## 2021-03-31 DIAGNOSIS — Z510 Encounter for antineoplastic radiation therapy: Secondary | ICD-10-CM | POA: Diagnosis not present

## 2021-04-01 ENCOUNTER — Ambulatory Visit
Admission: RE | Admit: 2021-04-01 | Discharge: 2021-04-01 | Disposition: A | Discharge status: Home / Self Care | Payer: Managed Care, Other (non HMO) | Source: Ambulatory Visit | Attending: Radiation Oncology | Admitting: Radiation Oncology

## 2021-04-01 ENCOUNTER — Ambulatory Visit: Payer: Managed Care, Other (non HMO)

## 2021-04-01 ENCOUNTER — Other Ambulatory Visit: Payer: Self-pay

## 2021-04-01 DIAGNOSIS — Z510 Encounter for antineoplastic radiation therapy: Secondary | ICD-10-CM | POA: Diagnosis not present

## 2021-04-01 NOTE — Progress Notes (Signed)
[place]   Telephone:(336) [phone] [phone]) [phone]   Clinic Follow up Note   Patient Care Team: Lorrene Reid, PA-C as PCP - [facility], [facility] Ob/Gyn Mcarthur Rossetti, MD as Consulting Physician (Orthopedic Surgery) Mauro Kaufmann, RN as Oncology Nurse Navigator Rockwell Germany, RN as Oncology Nurse Navigator Rolm Bookbinder, MD as Consulting Physician (General Surgery)  Date of Service:  04/03/2021  CHIEF COMPLAINT: F/u of left breast cancer  SUMMARY OF ONCOLOGIC HISTORY: Oncology History Overview Note  Cancer Staging Malignant neoplasm of upper-outer quadrant of left breast in female, estrogen receptor positive ([place]) Staging form: Breast, AJCC 8th Edition - Clinical stage from 01/01/2021: Stage IA (cT1c, cN0, cM0, G2, ER+, PR+, HER2-) - Signed by Truitt Merle, MD on 01/20/2021 Stage prefix: Initial diagnosis    Malignant neoplasm of upper-outer quadrant of left breast in female, estrogen receptor positive ([place])  01/01/2021 Mammogram   Mammogram 01/01/21  IMPRESSION: 1. 11x7x8 mm mass in the 1 o'clock position of the left breast 8cmfn, highly suspicious for breast malignancy. No left axillary lymphadenopathy.   01/01/2021 Initial Biopsy   Diagnosis 01/01/21 Breast, left, needle core biopsy, 1 o'clock, upper outer quadrant, ribbon clip - INVASIVE MAMMARY CARCINOMA, GRADE II. - SEE MICROSCOPIC DESCRIPTION. Microscopic Comment The greatest tumor length is 0.9 cm. E-cadherin and breast prognostic profile will be performed. 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    01/01/2021 Receptors her2   PROGNOSTIC INDICATORS Results: IMMUNOHISTOCHEMICAL AND MORPHOMETRIC ANALYSIS PERFORMED MANUALLY The tumor cells are EQUIVOCAL for Her2 (2+). HER2 by FISH will be performed and the results reported separately Estrogen Receptor: 95%, POSITIVE, STRONG STAINING INTENSITY Progesterone Receptor:  50%, POSITIVE, MODERATE STAINING INTENSITY Proliferation Marker Ki67: 20% FLUORESCENCE IN-SITU HYBRIDIZATION Results: GROUP 5: HER2 **NEGATIVE** Equivocal form of amplification of the HER2 gene was detected in the IHC 2+ tissue sample received from this individual. HER2 FISH was performed by a technologist and cell imaging and analysis on the BioView.   01/01/2021 Cancer Staging   Staging form: Breast, AJCC 8th Edition - Clinical stage from 01/01/2021: Stage IA (cT1c, cN0, cM0, G2, ER+, PR+, HER2-) - Signed by Truitt Merle, MD on 01/20/2021   01/16/2021 Initial Diagnosis   Malignant neoplasm of upper-outer quadrant of left breast in female, estrogen receptor positive ([place])   01/28/2021 Surgery   LEFT BREAST LUMPECTOMY WITH RADIOACTIVE SEED AND LEFT AXILLARY SENTINEL LYMPH NODE BIOPSY by Dr Donne Hazel    01/28/2021 Pathology Results   FINAL MICROSCOPIC DIAGNOSIS:   A. BREAST, LEFT W/SEED, LUMPECTOMY:  -  Invasive ductal carcinoma, Nottingham grade 2 of 3, 1.2 cm  -  Ductal carcinoma in-situ, intermediate grade  -  Calcifications associated with carcinoma  -  Margins uninvolved by carcinoma (see part B-E for final margin  status)  -  Previous biopsy site changes present  -  See oncology table below   B. BREAST, LEFT ADDITIONAL MEDIAL MARGIN, EXCISION:  -  No residual carcinoma identified   C. BREAST, LEFT ADDITIONAL POSTERIOR MARGIN, EXCISION:  -  No residual carcinoma identified   D. BREAST, LEFT ADDITIONAL LATERAL MARGIN, EXCISION:  -  No residual carcinoma   E. BREAST, LEFT ADDITIONAL SUPERIOR MARGIN, EXCISION:  -  No residual carcinoma identified  -  See comment   F. SENTINEL LYMPH NODE, LEFT AXILLARY, BIOPSY:  -  No carcinoma identified in one lymph node (0/1)  -  See comment   G. SENTINEL LYMPH NODE, LEFT AXILLARY, BIOPSY:  -  No carcinoma identified in one lymph node (0/1)  -  See comment   H. SENTINEL LYMPH NODE, LEFT AXILLARY, BIOPSY:  -  No carcinoma identified in one  lymph node (0/1)  -  See comment   COMMENT:   E.  There is cauterized epithelium which by immunohistochemistry  maintains a myoepithelial layer (positive for SMM and calponin).  Cytokeratin AE1/3 does not highlight an infiltrative epithelioid  component.   F-H.  Given the lobular-like morphology, cytokeratin AE1/3 was performed  on the lymph nodes to exclude isolated tumor cells in micrometastasis.  Cytokeratin AE1/3 is negative.   01/28/2021 Oncotype testing   Recurrence score 23 with distant recurrence risk of 9 years with AI/Tamoxifen at 9% She has less than 1% benefit of chemotherapy.    01/28/2021 Cancer Staging   Staging form: Breast, AJCC 8th Edition - Pathologic stage from 01/28/2021: pT4b, cM0, G2, ER+, PR+, HER2-, Oncotype DX score: 23 - Signed by Truitt Merle, MD on 04/03/2021 Stage prefix: Initial diagnosis Multigene prognostic tests performed: Oncotype DX Recurrence score range: Greater than or equal to 11 Histologic grading system: 3 grade system Residual tumor (R): R0 - None   03/09/2021 - 04/06/2021 Radiation Therapy   Adjuvant radiation with Dr Sondra Come    03/2021 -  Anti-estrogen oral therapy   Tamoxifen 13m once daily starting in late April or Early May 2022.       CURRENT THERAPY:  Adjuvant Radiation with Dr KSondra Come3/14/22-4/11/22 Tamoxifen 243monce daily starting in late April or Early May 2022.    INTERVAL HISTORY:  [name] here for a follow up of left breast cancer. She was last seen by me 3 month ago. She presents to the clinic alone. She notes she is tolerating her radiation well with fatigue and skin irration.  She notes her periods has been irregular for the past 2 years, last period was 10/2020. She notes she is interested in Tamoxifen.    REVIEW OF SYSTEMS:   Constitutional: Denies fevers, chills or abnormal weight loss Eyes: Denies blurriness of vision Ears, nose, mouth, throat, and face: Denies mucositis or sore throat Respiratory: Denies  cough, dyspnea or wheezes Cardiovascular: Denies palpitation, chest discomfort or lower extremity swelling Gastrointestinal:  Denies nausea, heartburn or change in bowel habits Skin: Denies abnormal skin rashes Lymphatics: Denies new lymphadenopathy or easy bruising Neurological:Denies numbness, tingling or new weaknesses Behavioral/Psych: Mood is stable, no new changes  All other systems were reviewed with the patient and are negative.  MEDICAL HISTORY:  Past Medical History:  Diagnosis Date  . Anxiety   . Medical history non-contributory     SURGICAL HISTORY: Past Surgical History:  Procedure Laterality Date  . BREAST LUMPECTOMY WITH RADIOACTIVE SEED AND SENTINEL LYMPH NODE BIOPSY Left 01/28/2021   Procedure: LEFT BREAST LUMPECTOMY WITH RADIOACTIVE SEED AND LEFT AXILLARY SENTINEL LYMPH NODE BIOPSY;  Surgeon: WaRolm BookbinderMD;  [facility] [facility]: [facility];  Laterality: Left;  PEC BLOCK  . BREAST SURGERY Left    breast biopsy  . EXCISION OF BREAST BIOPSY    . NO PAST SURGERIES      I have reviewed the social history and family history with the patient and they are unchanged from previous note.  ALLERGIES:  is allergic to latex.  MEDICATIONS:  Current Outpatient Medications  Medication Sig Dispense Refill  . nystatin (MYCOSTATIN/NYSTOP) powder Apply 1 application topically 3 (three) times daily. To skin folder underneath breasts 30 g 1  . tamoxifen (  NOLVADEX) 20 MG tablet Take 1 tablet (20 mg total) by mouth daily. 30 tablet 3  . oxyCODONE (OXY IR/ROXICODONE) 5 MG immediate release tablet Take 1 tablet (5 mg total) by mouth every 6 (six) hours as needed for severe pain. 10 tablet 0   No current facility-administered medications for this visit.    PHYSICAL EXAMINATION: ECOG PERFORMANCE STATUS: 1 - Symptomatic but completely ambulatory  Vitals:   04/03/21 1017  BP: (!) 142/98  Pulse: 76  Resp: 18  Temp: 97.7 F (36.5 C)  SpO2: 100%    Filed Weights   04/03/21 1017  Weight: 151 lb 12.8 oz (68.9 kg)    GENERAL:alert, no distress and comfortable SKIN: skin color, texture, turgor are normal, no rashes or significant lesions EYES: normal, Conjunctiva are pink and non-injected, sclera clear  NECK: supple, thyroid normal size, non-tender, without nodularity LYMPH:  no palpable lymphadenopathy in the cervical, axillary  LUNGS: clear to auscultation and percussion with normal breathing effort HEART: regular rate & rhythm and no murmurs and no lower extremity edema ABDOMEN:abdomen soft, non-tender and normal bowel sounds Musculoskeletal:no cyanosis of digits and no clubbing  NEURO: alert & oriented x 3 with fluent speech, no focal motor/sensory deficits BREAST: S/p left lumpectomy: Surgical incision healed well (+) Left breast skin erythema with firm skin from RT (+) Skin erythema under left breast skin fold. No palpable mass, nodules or adenopathy bilaterally. Breast exam benign.   LABORATORY DATA:  I have reviewed the data as listed CBC Latest Ref Rng & Units 04/03/2021 07/03/2019 12/28/2013  WBC 4.0 - 10.5 K/uL 4.6 4.6 8.2  Hemoglobin 12.0 - 15.0 g/dL 13.4 14.1 13.0  Hematocrit 36.0 - 46.0 % 40.0 41.1 38.6  Platelets 150 - 400 K/uL 221 317 212     CMP Latest Ref Rng & Units 04/03/2021 07/03/2019 11/29/2013  Glucose 70 - 99 mg/dL 95 96 111(H)  BUN 6 - 20 mg/dL '9 6 16  ' Creatinine 0.44 - 1.00 mg/dL 0.83 0.70 0.78  Sodium 135 - 145 mmol/L 141 137 138  Potassium 3.5 - 5.1 mmol/L 4.3 4.1 4.5  Chloride 98 - 111 mmol/L 104 97 100  CO2 22 - 32 mmol/L '25 21 22  ' Calcium 8.9 - 10.3 mg/dL 9.3 9.8 9.6  Total Protein 6.5 - 8.1 g/dL 7.9 7.6 8.1  Total Bilirubin 0.3 - 1.2 mg/dL 1.1 0.6 1.2  Alkaline Phos 38 - 126 U/L 81 59 43  AST 15 - 41 U/L 12(L) 12 20  ALT 0 - 44 U/L '7 7 13      ' RADIOGRAPHIC STUDIES: I have personally reviewed the radiological images as listed and agreed with the findings in the report. No results found.    ASSESSMENT & PLAN:  Cindy [name] is a 50 y.o. female with    1. Malignant neoplasm of upper-outer quadrant of left breast, Stage IA, pT1cN0M0, ER+/PR+/HER2-, Grade II, RS 23 -She was diagnosed in 12/2020 with 38m mass in left breast on mammogram. Biopsy confirmed invasive ductal carcinoma on biopsy, ER and PR positive and HER2 negative  -She underwent left lumpectomy with SLNB by Dr WDonne Hazelon 01/28/21. Surgical path showed 1.2cm grade II invasive ductal carcinoma and intermediate grade DCIS. Mass was completely removed with clear margins and LN negative. I personally reviewed with patient today  -Her Oncotype results showed recurrence score of 23 with distant recurrence of risk of 9 years with AI/Tamoxifen at 9%. Adjuvant chemo is not recommended due to low risk disease. I reviewed with  patient today. -To reduce her risk of local recurrence, she proceeded with adjuvant radiation with Dr Sondra Come on 03/09/21. She plans to complete on 04/06/21. She is tolerating well with mild fatigue and skin irritation. She also has moisture and skin erythema under breasts, which can lead to fungal infection. I called in Nystatin powder for this. She is agreeable.  -Given the strong ER and PR expression in her peri-menopausal status, I recommend adjuvant endocrine therapy with Tamoxifen for a total of 5-10 years to reduce the risk of cancer recurrence. I gave her print out of medication.   --The potential side effects, which includes but not limited to, hot flash, skin and vaginal dryness, slightly increased risk of cardiovascular disease and cataract, small risk of thrombosis and endometrial cancer, were discussed with her in great details. Preventive strategies for thrombosis, such as being physically active, using compression stocks, avoid cigarette smoking, etc., were reviewed with her. I also recommend her to follow-up with her gynecologist once a year, and watch for vaginal spotting or bleeding, as a clinically sign  of endometrial cancer, etc. She voiced good understanding, and agrees to proceed. Will start after she completes adjuvant breast radiation in late April or Early May  -She will proceed with survivorship clinic with NP Lacie in 3 months. She will f/u with me in 6 months.    2. Anxiety  -Not on medication.  -She has been having trouble sleeping recently.  -Will monitor on antiestrogen therapy.    PLAN:   -Baseline labs today, she is agreeable.  -I called in Nystatin powder and Tamoxifen. She can start Tamoxifen in late April or Early May  -Survivorship clinic with NP Lacie in 3 months -Lab and F/u with me in 6 months     No problem-specific Assessment & Plan notes found for this encounter.   No orders of the defined types were placed in this encounter.  All questions were answered. The patient knows to call the clinic with any problems, questions or concerns. No barriers to learning was detected. The total time spent in the appointment was 30 minutes.     Truitt Merle, MD 04/03/2021   I, Joslyn Devon, am acting as scribe for Truitt Merle, MD.   I have reviewed the above documentation for accuracy and completeness, and I agree with the above.

## 2021-04-02 ENCOUNTER — Ambulatory Visit: Payer: Managed Care, Other (non HMO)

## 2021-04-02 ENCOUNTER — Other Ambulatory Visit: Payer: Self-pay

## 2021-04-02 ENCOUNTER — Ambulatory Visit
Admission: RE | Admit: 2021-04-02 | Discharge: 2021-04-02 | Disposition: A | Discharge status: Home / Self Care | Payer: Managed Care, Other (non HMO) | Source: Ambulatory Visit | Attending: Radiation Oncology | Admitting: Radiation Oncology

## 2021-04-02 DIAGNOSIS — Z510 Encounter for antineoplastic radiation therapy: Secondary | ICD-10-CM | POA: Diagnosis not present

## 2021-04-03 ENCOUNTER — Inpatient Hospital Stay: Payer: Managed Care, Other (non HMO)

## 2021-04-03 ENCOUNTER — Inpatient Hospital Stay: Payer: Managed Care, Other (non HMO) | Attending: Hematology | Admitting: Hematology

## 2021-04-03 ENCOUNTER — Ambulatory Visit: Payer: Managed Care, Other (non HMO)

## 2021-04-03 ENCOUNTER — Ambulatory Visit
Admission: RE | Admit: 2021-04-03 | Discharge: 2021-04-03 | Disposition: A | Discharge status: Home / Self Care | Payer: Managed Care, Other (non HMO) | Source: Ambulatory Visit | Attending: Radiation Oncology | Admitting: Radiation Oncology

## 2021-04-03 ENCOUNTER — Other Ambulatory Visit: Payer: Self-pay

## 2021-04-03 ENCOUNTER — Encounter: Payer: Self-pay | Admitting: Hematology

## 2021-04-03 VITALS — BP 142/98 | HR 76 | Temp 97.7°F | Resp 18 | Ht 62.0 in | Wt 151.8 lb

## 2021-04-03 DIAGNOSIS — C50412 Malignant neoplasm of upper-outer quadrant of left female breast: Secondary | ICD-10-CM | POA: Diagnosis not present

## 2021-04-03 DIAGNOSIS — Z923 Personal history of irradiation: Secondary | ICD-10-CM | POA: Insufficient documentation

## 2021-04-03 DIAGNOSIS — Z170 Estrogen receptor positive status [ER+]: Secondary | ICD-10-CM | POA: Diagnosis not present

## 2021-04-03 DIAGNOSIS — Z510 Encounter for antineoplastic radiation therapy: Secondary | ICD-10-CM | POA: Insufficient documentation

## 2021-04-03 DIAGNOSIS — Z79899 Other long term (current) drug therapy: Secondary | ICD-10-CM | POA: Insufficient documentation

## 2021-04-03 DIAGNOSIS — R5383 Other fatigue: Secondary | ICD-10-CM | POA: Insufficient documentation

## 2021-04-03 DIAGNOSIS — Z171 Estrogen receptor negative status [ER-]: Secondary | ICD-10-CM | POA: Insufficient documentation

## 2021-04-03 DIAGNOSIS — F419 Anxiety disorder, unspecified: Secondary | ICD-10-CM | POA: Insufficient documentation

## 2021-04-03 DIAGNOSIS — Z79811 Long term (current) use of aromatase inhibitors: Secondary | ICD-10-CM | POA: Insufficient documentation

## 2021-04-03 LAB — CBC WITH DIFFERENTIAL (CANCER CENTER ONLY)
Abs Immature Granulocytes: 0.01 10*3/uL (ref 0.00–0.07)
Basophils Absolute: 0 10*3/uL (ref 0.0–0.1)
Basophils Relative: 1 %
Eosinophils Absolute: 0.1 10*3/uL (ref 0.0–0.5)
Eosinophils Relative: 1 %
HCT: 40 % (ref 36.0–46.0)
Hemoglobin: 13.4 g/dL (ref 12.0–15.0)
Immature Granulocytes: 0 %
Lymphocytes Relative: 17 %
Lymphs Abs: 0.8 10*3/uL (ref 0.7–4.0)
MCH: 30.3 pg (ref 26.0–34.0)
MCHC: 33.5 g/dL (ref 30.0–36.0)
MCV: 90.5 fL (ref 80.0–100.0)
Monocytes Absolute: 0.4 10*3/uL (ref 0.1–1.0)
Monocytes Relative: 8 %
Neutro Abs: 3.4 10*3/uL (ref 1.7–7.7)
Neutrophils Relative %: 73 %
Platelet Count: 221 10*3/uL (ref 150–400)
RBC: 4.42 MIL/uL (ref 3.87–5.11)
RDW: 12 % (ref 11.5–15.5)
WBC Count: 4.6 10*3/uL (ref 4.0–10.5)
nRBC: 0 % (ref 0.0–0.2)

## 2021-04-03 LAB — CMP (CANCER CENTER ONLY)
ALT: 7 U/L (ref 0–44)
AST: 12 U/L — ABNORMAL LOW (ref 15–41)
Albumin: 4.7 g/dL (ref 3.5–5.0)
Alkaline Phosphatase: 81 U/L (ref 38–126)
Anion gap: 12 (ref 5–15)
BUN: 9 mg/dL (ref 6–20)
CO2: 25 mmol/L (ref 22–32)
Calcium: 9.3 mg/dL (ref 8.9–10.3)
Chloride: 104 mmol/L (ref 98–111)
Creatinine: 0.83 mg/dL (ref 0.44–1.00)
GFR, Estimated: >60 mL/min (ref >60)
Glucose, Bld: 95 mg/dL (ref 70–99)
Potassium: 4.3 mmol/L (ref 3.5–5.1)
Sodium: 141 mmol/L (ref 135–145)
Total Bilirubin: 1.1 mg/dL (ref 0.3–1.2)
Total Protein: 7.9 g/dL (ref 6.5–8.1)

## 2021-04-03 MED ORDER — TAMOXIFEN CITRATE 20 MG PO TABS: 20.0000 mg | ORAL_TABLET | Freq: Every day | ORAL | 3 refills | Status: DC

## 2021-04-03 MED ORDER — NYSTATIN 100000 UNIT/GM EX POWD
1.0000 | Freq: Three times a day (TID) | CUTANEOUS | 1 refills | Status: DC
Start: 2021-04-03 — End: 2021-11-27

## 2021-04-06 ENCOUNTER — Encounter: Payer: Self-pay | Admitting: *Deleted

## 2021-04-06 ENCOUNTER — Ambulatory Visit
Admission: RE | Admit: 2021-04-06 | Discharge: 2021-04-06 | Disposition: A | Discharge status: Home / Self Care | Payer: Managed Care, Other (non HMO) | Source: Ambulatory Visit | Attending: Radiation Oncology | Admitting: Radiation Oncology

## 2021-04-06 ENCOUNTER — Telehealth: Payer: Self-pay | Admitting: Hematology

## 2021-04-06 ENCOUNTER — Other Ambulatory Visit: Payer: Self-pay

## 2021-04-06 DIAGNOSIS — Z510 Encounter for antineoplastic radiation therapy: Secondary | ICD-10-CM | POA: Diagnosis not present

## 2021-04-06 NOTE — Telephone Encounter (Signed)
Scheduled per los. Called and spoke with patient. Confirmed appt 

## 2021-04-13 ENCOUNTER — Telehealth: Payer: Self-pay | Admitting: Radiology

## 2021-04-13 NOTE — Telephone Encounter (Signed)
Per Dr Clabe Seal instructions, advised patient to use neosporin plus 3 times daily and hydrocortisone twice daily, alternating medications. Advised her to call back if this doesn't improve her symptoms. Questions answered. Patient verbalized understanding.

## 2021-04-13 NOTE — Telephone Encounter (Signed)
Patient reports itching, peeling and moisture under breast following radiation treatment. Reports using neosporin. Would like to have this seen.

## 2021-04-14 ENCOUNTER — Ambulatory Visit
Admission: RE | Admit: 2021-04-14 | Discharge: 2021-04-14 | Disposition: A | Discharge status: Home / Self Care | Payer: Managed Care, Other (non HMO) | Source: Ambulatory Visit | Attending: Radiation Oncology | Admitting: Radiation Oncology

## 2021-04-14 ENCOUNTER — Encounter: Payer: Self-pay | Admitting: Radiation Oncology

## 2021-04-14 ENCOUNTER — Telehealth: Payer: Self-pay | Admitting: *Deleted

## 2021-04-14 ENCOUNTER — Other Ambulatory Visit: Payer: Self-pay

## 2021-04-14 DIAGNOSIS — Z79899 Other long term (current) drug therapy: Secondary | ICD-10-CM | POA: Insufficient documentation

## 2021-04-14 DIAGNOSIS — Z923 Personal history of irradiation: Secondary | ICD-10-CM | POA: Diagnosis not present

## 2021-04-14 DIAGNOSIS — Z170 Estrogen receptor positive status [ER+]: Secondary | ICD-10-CM | POA: Diagnosis not present

## 2021-04-14 DIAGNOSIS — C50412 Malignant neoplasm of upper-outer quadrant of left female breast: Secondary | ICD-10-CM | POA: Diagnosis not present

## 2021-04-14 MED ORDER — SILVER SULFADIAZINE 1 % EX CREA: TOPICAL_CREAM | Freq: Once | CUTANEOUS | Status: AC

## 2021-04-14 NOTE — Progress Notes (Signed)
  Radiation Oncology         [phone]) [phone] ________________________________  Name: Cindy [name] MRN: [id number]  Date: 04/14/2021  DOB: 12-07-1971  Follow-Up Visit Note  CC: Lorrene Reid, Serena Colonel, MD    ICD-10-CM   1. Malignant neoplasm of upper-outer quadrant of left breast in female, estrogen receptor positive ([place])  C50.412 silver sulfADIAZINE (SILVADENE) 1 % cream   Z17.0     Diagnosis:   Left breast cancer  Interval Since Last Radiation:  1 weeks  Narrative:  The patient returns today for requested early follow-up.  She has had significant discomfort in the inframammary fold along the left side since completing her treatment.  She did place hydrocortisone cream in this area which actually caused more burning.                         ALLERGIES:  is allergic to latex.  Meds: Current Outpatient Medications  Medication Sig Dispense Refill  . tamoxifen (NOLVADEX) 20 MG tablet Take 1 tablet (20 mg total) by mouth daily. 30 tablet 3  . nystatin (MYCOSTATIN/NYSTOP) powder Apply 1 application topically 3 (three) times daily. To skin folder underneath breasts (Patient not taking: Reported on 04/14/2021) 30 g 1  . oxyCODONE (OXY IR/ROXICODONE) 5 MG immediate release tablet Take 1 tablet (5 mg total) by mouth every 6 (six) hours as needed for severe pain. (Patient not taking: Reported on 04/14/2021) 10 tablet 0   No current facility-administered medications for this encounter.    Physical Findings: The patient is in no acute distress. Patient is alert and oriented.  height is 5\' 2"  (1.575 m) and weight is 152 lb 6.4 oz (69.1 kg). Her temperature is 97.8 F (36.6 C). Her blood pressure is 146/86 (abnormal) and her pulse is 92. Her respiration is 20 and oxygen saturation is 100%. .  The lungs are clear.  The heart has a regular rhythm and rate.  The left breast area shows hyperpigmentation changes and erythema.  The in the inframammary fold the patient has minimal moist  desquamation.  No signs of infection.  Lab Findings: Lab Results  Component Value Date   WBC 4.6 04/03/2021   HGB 13.4 04/03/2021   HCT 40.0 04/03/2021   MCV 90.5 04/03/2021   PLT 221 04/03/2021    Radiographic Findings: No results found.  Impression:  The patient is recovering from the effects of radiation.  She is having significant discomfort in the inframammary fold area.   Plan: She has been given  Silvadene to place in this area.  She will call back if her symptoms worsen or no improvement in a week otherwise she will keep her already schedule I month follow-up.  ____________________________________ Gery Pray, MD

## 2021-04-14 NOTE — Telephone Encounter (Signed)
CALLED PATIENT TO ASK ABOUT COMING IN FOR A SKIN CHECK TODAY, PATIENT AGREED TO COME IN @ 1 PM

## 2021-04-14 NOTE — Progress Notes (Signed)
Cindy [name] is here today for follow up post radiation to the breast.   Breast Side left   They completed their radiation on: 04/06/21  Does the patient complain of any of the following: . Post radiation skin issues: Noted patient to have open area under left breast and peeling on top of left breast.  Patient using Neosporin to breast.  . Breast Tenderness: yes . Breast Swelling: yes . Lymphadema: no . Range of Motion limitations: limited range of motion due to pain . Fatigue post radiation: Patient reports energy level coming back gradually. Marland Kitchen Appetite good/[name]/poor: good  Additional comments if applicable:  Vitals:   98/92/11 1310  BP: (!) 146/86  Pulse: 92  Resp: 20  Temp: 97.8 F (36.6 C)  SpO2: 100%  Weight: 152 lb 6.4 oz (69.1 kg)  Height: 5\' 2"  (1.575 m)

## 2021-04-20 ENCOUNTER — Telehealth: Payer: Self-pay

## 2021-04-20 NOTE — Telephone Encounter (Signed)
Patient called in on Saturday with complaints of having a rash under her left breast. Patient reports the area is red, raised and itchy. She was last seen in the office by Dr. Sondra Come for a follow up skin check on 04/14/21. She was given silvadene ointment to apply under breast. Patient reports also using nystatin powder which has not relieved any symptoms she is having. She is questioning if she could have something to take orally to assist with healing of left breast. Patient last radiation treatment was on 04/06/21.

## 2021-04-21 ENCOUNTER — Telehealth: Payer: Self-pay | Admitting: *Deleted

## 2021-04-21 ENCOUNTER — Other Ambulatory Visit: Payer: Self-pay | Admitting: Radiation Oncology

## 2021-04-21 ENCOUNTER — Other Ambulatory Visit: Payer: Self-pay

## 2021-04-21 ENCOUNTER — Ambulatory Visit
Admission: RE | Admit: 2021-04-21 | Discharge: 2021-04-21 | Disposition: A | Discharge status: Home / Self Care | Payer: Managed Care, Other (non HMO) | Source: Ambulatory Visit | Attending: Radiation Oncology | Admitting: Radiation Oncology

## 2021-04-21 ENCOUNTER — Encounter: Payer: Self-pay | Admitting: Radiation Oncology

## 2021-04-21 DIAGNOSIS — C50412 Malignant neoplasm of upper-outer quadrant of left female breast: Secondary | ICD-10-CM

## 2021-04-21 DIAGNOSIS — Z510 Encounter for antineoplastic radiation therapy: Secondary | ICD-10-CM | POA: Diagnosis not present

## 2021-04-21 DIAGNOSIS — Z170 Estrogen receptor positive status [ER+]: Secondary | ICD-10-CM

## 2021-04-21 MED ORDER — NYSTATIN-TRIAMCINOLONE 100000-0.1 UNIT/GM-% EX OINT: 1.0000 "application " | TOPICAL_OINTMENT | Freq: Two times a day (BID) | CUTANEOUS | 0 refills | Status: DC

## 2021-04-21 NOTE — Telephone Encounter (Signed)
CALLED PATIENT TO ASK ABOUT COMING IN FOR SKIN CHECK TODAY @ 2 OR 2:30, PATIENT AGREED TO COME IN @ 2 PM

## 2021-04-21 NOTE — Progress Notes (Signed)
Patient in for skin check due to complaints of redness and moisture to left breast since completion of radiation on 04/06/21. Patient reports using Nizoral powder which has not been effective. Noted left breast to be swollen. Noted peeling under left breast and rash under breast, stomach and under left axilla. Patient reports area to be very itchy.Patient has full range of motion to left arm. Reports energy level to be improving.

## 2021-04-21 NOTE — Progress Notes (Signed)
Radiation Oncology         [phone] ________________________________  Name: Cindy [name] MRN: [id number]  Date: 04/21/2021  DOB: [birth year]  Follow-Up Visit Note  CC: Lorrene Reid, Serena Colonel, MD    ICD-10-CM   1. Malignant neoplasm of upper-outer quadrant of left breast in female, estrogen receptor positive ([place])  C50.412    Z17.0     Diagnosis: StageIA(pT1c, pN0)LeftBreast UOQ,Invasive DuctalCarcinoma with intermediate grade DCIS, ER+/ PR+/ Her2-, Grade2  Interval Since Last Radiation: 2 weeks  Narrative:  The patient returns today for skin re-check.  She is continued to be bothered with skin problems related to her recent radiation therapy.  She was recently seen and placed on Silvadene which did not seem to help significantly.  The patient has tried antifungal powder which actually caused significant burning in the area.  Over the past couple of days she has stopped using anything and her rash seems to be a little better per her evaluation.            ALLERGIES:  is allergic to latex.  Meds: Current Outpatient Medications  Medication Sig Dispense Refill  . nystatin (MYCOSTATIN/NYSTOP) powder Apply 1 application topically 3 (three) times daily. To skin folder underneath breasts (Patient not taking: Reported on 04/14/2021) 30 g 1  . nystatin-triamcinolone ointment (MYCOLOG) Apply 1 application topically 2 (two) times daily. 30 g 0  . oxyCODONE (OXY IR/ROXICODONE) 5 MG immediate release tablet Take 1 tablet (5 mg total) by mouth every 6 (six) hours as needed for severe pain. (Patient not taking: Reported on 04/14/2021) 10 tablet 0  . tamoxifen (NOLVADEX) 20 MG tablet Take 1 tablet (20 mg total) by mouth daily. 30 tablet 3   No current facility-administered medications for this encounter.    Physical Findings: The patient is in no acute distress. Patient is alert and oriented.  height is '5\' 2"'  (1.575 m) and weight is 151 lb 9.6 oz (68.8 kg). Her  temporal temperature is 98.1 F (36.7 C). Her blood pressure is 131/81 and her pulse is 87. Her respiration is 18 and oxygen saturation is 99%.  The lungs are clear.  The heart has a regular rhythm and rate. The left breast area shows skin is healed well at this time.  Hyperpigmentation changes noted and some diffuse swelling in the breast but no signs of infection.  Patient however does have a maculopapular rash throughout the left breast extending into the left upper abdomen  outside of her previous radiation field.  This also extends up into the upper chest and right upper chest region.  In addition this extends into the left axillary region.  Lab Findings: Lab Results  Component Value Date   WBC 4.6 04/03/2021   HGB 13.4 04/03/2021   HCT 40.0 04/03/2021   MCV 90.5 04/03/2021   PLT 221 04/03/2021    Radiographic Findings: No results found.  Impression: StageIA(pT1c, pN0)LeftBreast UOQ,Invasive DuctalCarcinoma with intermediate grade DCIS, ER+/ PR+/ Her2-, Grade2  The patient is recovering from the effects of radiation.  The etiology of the patient's rash is unknown.  Recommended since her skin is doing little better with no skin application to try this for a few more days if not better I have given her a prescription for Mycolog ointment to place on the affected areas.    Plan: she will return for routine follow-up in approximately 2 weeks.  She will begin using the prescription above if no improvement after the next few days  of not using any topical application.  ____________________________________   Blair Promise, PhD, MD  This document serves as a record of services personally performed by Gery Pray, MD. It was created on his behalf by Clerance Lav, a trained medical scribe. The creation of this record is based on the scribe's personal observations and the provider's statements to them. This document has been checked and approved by the attending provider.

## 2021-04-22 ENCOUNTER — Encounter: Payer: Self-pay | Admitting: Radiology

## 2021-04-28 ENCOUNTER — Ambulatory Visit
Admission: RE | Admit: 2021-04-28 | Discharge: 2021-04-28 | Disposition: A | Discharge status: Home / Self Care | Payer: Managed Care, Other (non HMO) | Source: Ambulatory Visit | Attending: Radiation Oncology | Admitting: Radiation Oncology

## 2021-04-28 ENCOUNTER — Encounter: Payer: Self-pay | Admitting: Radiation Oncology

## 2021-04-28 ENCOUNTER — Other Ambulatory Visit: Payer: Self-pay

## 2021-04-28 VITALS — BP 133/91 | HR 90 | Temp 97.7°F | Resp 18 | Ht 62.0 in | Wt 151.8 lb

## 2021-04-28 DIAGNOSIS — Z79899 Other long term (current) drug therapy: Secondary | ICD-10-CM | POA: Insufficient documentation

## 2021-04-28 DIAGNOSIS — Z170 Estrogen receptor positive status [ER+]: Secondary | ICD-10-CM | POA: Diagnosis not present

## 2021-04-28 DIAGNOSIS — Z923 Personal history of irradiation: Secondary | ICD-10-CM | POA: Insufficient documentation

## 2021-04-28 DIAGNOSIS — C50412 Malignant neoplasm of upper-outer quadrant of left female breast: Secondary | ICD-10-CM | POA: Diagnosis present

## 2021-04-28 NOTE — Progress Notes (Signed)
Radiation Oncology         [phone] ________________________________  Name: Cindy [name] MRN: [id number]  Date: 04/28/2021  DOB: [birth year]  Follow-Up Visit Note  CC: Lorrene Reid, Serena Colonel, MD    ICD-10-CM   1. Malignant neoplasm of upper-outer quadrant of left breast in female, estrogen receptor positive ([place])  C50.412    Z17.0     Diagnosis: StageIA(pT1c, pN0)LeftBreast UOQ,Invasive DuctalCarcinoma with intermediate grade DCIS, ER+/ PR+/ Her2-, Grade2  Interval Since Last Radiation: 3 weeks  Narrative:  The patient returns today for skin re-check.  She is continued to be bothered with skin problems related to her recent radiation therapy.  She was recently seen and placed on Silvadene which did not seem to help significantly.  The patient has tried antifungal powder which actually caused significant burning in the area.  Over the past couple of days she has stopped using anything and her rash seems to be a little better per her evaluation.    Over the past week the patient is using any skin creams along most of the breast, but has not used the Mycolog cream that given her in the inframammary fold.  Overall her itching and discomfort has improved significantly over the past week.  She continues to have some swelling in the breast but has not requiring any pain medication for this issue.  She describes this as a discomfort and pain.  She denies any nipple discharge or bleeding.        ALLERGIES:  is allergic to latex.  Meds: Current Outpatient Medications  Medication Sig Dispense Refill  . nystatin-triamcinolone ointment (MYCOLOG) Apply 1 application topically 2 (two) times daily. 30 g 0  . tamoxifen (NOLVADEX) 20 MG tablet Take 1 tablet (20 mg total) by mouth daily. 30 tablet 3  . nystatin (MYCOSTATIN/NYSTOP) powder Apply 1 application topically 3 (three) times daily. To skin folder underneath breasts (Patient not taking: Reported on 04/28/2021) 30 g 1   . oxyCODONE (OXY IR/ROXICODONE) 5 MG immediate release tablet Take 1 tablet (5 mg total) by mouth every 6 (six) hours as needed for severe pain. (Patient not taking: No sig reported) 10 tablet 0   No current facility-administered medications for this encounter.    Physical Findings: The patient is in no acute distress. Patient is alert and oriented.  height is '5\' 2"'  (1.575 m) and weight is 151 lb 12.8 oz (68.9 kg). Her temperature is 97.7 F (36.5 C). Her blood pressure is 133/91 (abnormal) and her pulse is 90. Her respiration is 18 and oxygen saturation is 100%.  The lungs are clear.  The heart has a regular rhythm and rate. The left breast area shows skin is healed well at this time.  Hyperpigmentation changes noted and some diffuse swelling in the breast but no signs of infection.  Some erythema noted to the inframammary fold area but no skin breakdown.  This is minimal compared to previous exams. Lab Findings: Lab Results  Component Value Date   WBC 4.6 04/03/2021   HGB 13.4 04/03/2021   HCT 40.0 04/03/2021   MCV 90.5 04/03/2021   PLT 221 04/03/2021    Radiographic Findings: No results found.  Impression: StageIA(pT1c, pN0)LeftBreast UOQ,Invasive DuctalCarcinoma with intermediate grade DCIS, ER+/ PR+/ Her2-, Grade2  The patient is recovering from the effects of radiation.  She is doing much better this week.  She will continue use Mycolog on occasion in the infra mammary fold.  Plan: she will return for routine  follow-up in approximately 2 months.  We discussed breast lymphedema management.  The patient is scheduled for additional Sozo measurements next week.  Discussed potential referral for breast lymphedema at a later date if necessary..  ____________________________________   Blair Promise, PhD, MD  This document serves as a record of services personally performed by Gery Pray, MD. It was created on his behalf by Clerance Lav, a trained medical scribe. The  creation of this record is based on the scribe's personal observations and the provider's statements to them. This document has been checked and approved by the attending provider.

## 2021-04-28 NOTE — Progress Notes (Signed)
She rates her pain as a 3 on a scale of 0-10. intermittent, dull and stabbing over left breast.  Pt complains of loss of sleep similar to baseline. Denies fatigue, weakness and poor appetite .   Pt left breast- positive for Rash, Dryness, Hyperpigmentation, Pruritus, skin intact, erythema, breast tenderness and moist desquamation.  Left breast is swollen.  Pt denies edema to left arm or axilla. left breast is swollen..   Pt continues to apply nystatin cream as directed.  Vitals:   04/28/21 1053  BP: (!) 133/91  Pulse: 90  Resp: 18  Temp: 97.7 F (36.5 C)  SpO2: 100%  Weight: 151 lb 12.8 oz (68.9 kg)  Height: 5\' 2"  (1.575 m)     Pt reports Yes No Comments  Tamoxifen [x]  []    Letrozole []  [x]    Arimidex []  [x]    Mammogram []  Date:  [x] 

## 2021-05-04 ENCOUNTER — Ambulatory Visit: Payer: Managed Care, Other (non HMO) | Attending: General Surgery

## 2021-05-04 ENCOUNTER — Other Ambulatory Visit: Payer: Self-pay

## 2021-05-04 DIAGNOSIS — G8929 Other chronic pain: Secondary | ICD-10-CM | POA: Insufficient documentation

## 2021-05-04 DIAGNOSIS — M25612 Stiffness of left shoulder, not elsewhere classified: Secondary | ICD-10-CM | POA: Insufficient documentation

## 2021-05-04 DIAGNOSIS — R600 Localized edema: Secondary | ICD-10-CM | POA: Insufficient documentation

## 2021-05-04 DIAGNOSIS — N63 Unspecified lump in unspecified breast: Secondary | ICD-10-CM | POA: Insufficient documentation

## 2021-05-04 DIAGNOSIS — Z170 Estrogen receptor positive status [ER+]: Secondary | ICD-10-CM | POA: Insufficient documentation

## 2021-05-04 DIAGNOSIS — C50412 Malignant neoplasm of upper-outer quadrant of left female breast: Secondary | ICD-10-CM | POA: Insufficient documentation

## 2021-05-04 DIAGNOSIS — Z483 Aftercare following surgery for neoplasm: Secondary | ICD-10-CM | POA: Insufficient documentation

## 2021-05-04 DIAGNOSIS — R293 Abnormal posture: Secondary | ICD-10-CM | POA: Insufficient documentation

## 2021-05-04 DIAGNOSIS — M25512 Pain in left shoulder: Secondary | ICD-10-CM | POA: Insufficient documentation
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
## 2021-05-07 ENCOUNTER — Ambulatory Visit: Payer: Self-pay | Admitting: Radiation Oncology

## 2021-05-13 ENCOUNTER — Other Ambulatory Visit: Payer: Self-pay

## 2021-05-13 ENCOUNTER — Ambulatory Visit: Payer: Managed Care, Other (non HMO)

## 2021-05-13 DIAGNOSIS — C50412 Malignant neoplasm of upper-outer quadrant of left female breast: Secondary | ICD-10-CM

## 2021-05-13 DIAGNOSIS — M25512 Pain in left shoulder: Secondary | ICD-10-CM | POA: Diagnosis present

## 2021-05-13 DIAGNOSIS — Z170 Estrogen receptor positive status [ER+]: Secondary | ICD-10-CM | POA: Diagnosis present

## 2021-05-13 DIAGNOSIS — R600 Localized edema: Secondary | ICD-10-CM | POA: Diagnosis present

## 2021-05-13 DIAGNOSIS — M25612 Stiffness of left shoulder, not elsewhere classified: Secondary | ICD-10-CM

## 2021-05-13 DIAGNOSIS — R293 Abnormal posture: Secondary | ICD-10-CM | POA: Diagnosis present

## 2021-05-13 DIAGNOSIS — Z483 Aftercare following surgery for neoplasm: Secondary | ICD-10-CM | POA: Diagnosis present

## 2021-05-13 DIAGNOSIS — N63 Unspecified lump in unspecified breast: Secondary | ICD-10-CM

## 2021-05-13 DIAGNOSIS — G8929 Other chronic pain: Secondary | ICD-10-CM | POA: Diagnosis present
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
## 2021-05-19 ENCOUNTER — Ambulatory Visit: Payer: Managed Care, Other (non HMO)

## 2021-05-19 ENCOUNTER — Other Ambulatory Visit: Payer: Self-pay

## 2021-05-19 DIAGNOSIS — N63 Unspecified lump in unspecified breast: Secondary | ICD-10-CM

## 2021-05-19 DIAGNOSIS — R600 Localized edema: Secondary | ICD-10-CM

## 2021-05-19 DIAGNOSIS — M25612 Stiffness of left shoulder, not elsewhere classified: Secondary | ICD-10-CM

## 2021-05-19 DIAGNOSIS — Z170 Estrogen receptor positive status [ER+]: Secondary | ICD-10-CM

## 2021-05-19 DIAGNOSIS — Z483 Aftercare following surgery for neoplasm: Secondary | ICD-10-CM | POA: Diagnosis not present

## 2021-05-19 DIAGNOSIS — G8929 Other chronic pain: Secondary | ICD-10-CM

## 2021-05-19 DIAGNOSIS — C50412 Malignant neoplasm of upper-outer quadrant of left female breast: Secondary | ICD-10-CM

## 2021-05-19 DIAGNOSIS — R293 Abnormal posture: Secondary | ICD-10-CM

## 2021-05-19 DIAGNOSIS — M25512 Pain in left shoulder: Secondary | ICD-10-CM
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
## 2021-05-19 NOTE — Patient Instructions (Signed)
Pt given illustrated and written instructions for self Left Breast MLD

## 2021-05-27 ENCOUNTER — Ambulatory Visit: Payer: Managed Care, Other (non HMO) | Attending: General Surgery | Admitting: Physical Therapy

## 2021-05-27 ENCOUNTER — Other Ambulatory Visit: Payer: Self-pay

## 2021-05-27 ENCOUNTER — Encounter: Payer: Self-pay | Admitting: Physical Therapy

## 2021-05-27 DIAGNOSIS — Z483 Aftercare following surgery for neoplasm: Secondary | ICD-10-CM | POA: Diagnosis present

## 2021-05-27 DIAGNOSIS — G8929 Other chronic pain: Secondary | ICD-10-CM | POA: Diagnosis present

## 2021-05-27 DIAGNOSIS — M25612 Stiffness of left shoulder, not elsewhere classified: Secondary | ICD-10-CM | POA: Diagnosis present

## 2021-05-27 DIAGNOSIS — M25512 Pain in left shoulder: Secondary | ICD-10-CM | POA: Insufficient documentation

## 2021-05-27 DIAGNOSIS — R600 Localized edema: Secondary | ICD-10-CM | POA: Insufficient documentation

## 2021-05-27 DIAGNOSIS — C50412 Malignant neoplasm of upper-outer quadrant of left female breast: Secondary | ICD-10-CM | POA: Insufficient documentation

## 2021-05-27 DIAGNOSIS — R293 Abnormal posture: Secondary | ICD-10-CM | POA: Diagnosis present

## 2021-05-27 DIAGNOSIS — N63 Unspecified lump in unspecified breast: Secondary | ICD-10-CM | POA: Diagnosis not present

## 2021-05-27 DIAGNOSIS — Z170 Estrogen receptor positive status [ER+]: Secondary | ICD-10-CM | POA: Insufficient documentation
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
## 2021-06-01 ENCOUNTER — Ambulatory Visit: Payer: Managed Care, Other (non HMO)

## 2021-06-01 ENCOUNTER — Other Ambulatory Visit: Payer: Self-pay

## 2021-06-01 DIAGNOSIS — Z483 Aftercare following surgery for neoplasm: Secondary | ICD-10-CM

## 2021-06-01 DIAGNOSIS — C50412 Malignant neoplasm of upper-outer quadrant of left female breast: Secondary | ICD-10-CM

## 2021-06-01 DIAGNOSIS — R600 Localized edema: Secondary | ICD-10-CM

## 2021-06-01 DIAGNOSIS — M25612 Stiffness of left shoulder, not elsewhere classified: Secondary | ICD-10-CM

## 2021-06-01 DIAGNOSIS — G8929 Other chronic pain: Secondary | ICD-10-CM

## 2021-06-01 DIAGNOSIS — R293 Abnormal posture: Secondary | ICD-10-CM

## 2021-06-01 DIAGNOSIS — Z170 Estrogen receptor positive status [ER+]: Secondary | ICD-10-CM

## 2021-06-01 DIAGNOSIS — N63 Unspecified lump in unspecified breast: Secondary | ICD-10-CM | POA: Diagnosis not present
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
## 2021-06-03 ENCOUNTER — Encounter: Payer: Self-pay | Admitting: Physical Therapy

## 2021-06-03 ENCOUNTER — Other Ambulatory Visit: Payer: Self-pay

## 2021-06-03 ENCOUNTER — Ambulatory Visit: Payer: Managed Care, Other (non HMO) | Admitting: Physical Therapy

## 2021-06-03 DIAGNOSIS — N63 Unspecified lump in unspecified breast: Secondary | ICD-10-CM

## 2021-06-03 DIAGNOSIS — Z483 Aftercare following surgery for neoplasm: Secondary | ICD-10-CM

## 2021-06-03 DIAGNOSIS — M25612 Stiffness of left shoulder, not elsewhere classified: Secondary | ICD-10-CM
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
## 2021-06-08 ENCOUNTER — Encounter: Payer: Managed Care, Other (non HMO) | Admitting: Physical Therapy

## 2021-06-10 ENCOUNTER — Encounter: Payer: Managed Care, Other (non HMO) | Admitting: Physical Therapy

## 2021-06-18 ENCOUNTER — Encounter: Payer: Managed Care, Other (non HMO) | Admitting: Rehabilitation

## 2021-07-02 ENCOUNTER — Inpatient Hospital Stay: Payer: Managed Care, Other (non HMO) | Attending: Hematology | Admitting: Nurse Practitioner

## 2021-07-02 ENCOUNTER — Encounter: Payer: Self-pay | Admitting: Nurse Practitioner

## 2021-07-02 ENCOUNTER — Other Ambulatory Visit: Payer: Self-pay

## 2021-07-02 VITALS — BP 147/85 | HR 82 | Temp 98.7°F | Resp 18 | Ht 62.0 in | Wt 153.9 lb

## 2021-07-02 DIAGNOSIS — R232 Flushing: Secondary | ICD-10-CM | POA: Diagnosis not present

## 2021-07-02 DIAGNOSIS — Z79810 Long term (current) use of selective estrogen receptor modulators (SERMs): Secondary | ICD-10-CM | POA: Diagnosis not present

## 2021-07-02 DIAGNOSIS — R21 Rash and other nonspecific skin eruption: Secondary | ICD-10-CM | POA: Insufficient documentation

## 2021-07-02 DIAGNOSIS — Z9221 Personal history of antineoplastic chemotherapy: Secondary | ICD-10-CM | POA: Diagnosis not present

## 2021-07-02 DIAGNOSIS — Z1211 Encounter for screening for malignant neoplasm of colon: Secondary | ICD-10-CM | POA: Diagnosis not present

## 2021-07-02 DIAGNOSIS — Z79899 Other long term (current) drug therapy: Secondary | ICD-10-CM | POA: Diagnosis not present

## 2021-07-02 DIAGNOSIS — Z801 Family history of malignant neoplasm of trachea, bronchus and lung: Secondary | ICD-10-CM | POA: Insufficient documentation

## 2021-07-02 DIAGNOSIS — C50412 Malignant neoplasm of upper-outer quadrant of left female breast: Secondary | ICD-10-CM | POA: Insufficient documentation

## 2021-07-02 DIAGNOSIS — Z923 Personal history of irradiation: Secondary | ICD-10-CM | POA: Diagnosis not present

## 2021-07-02 DIAGNOSIS — Z170 Estrogen receptor positive status [ER+]: Secondary | ICD-10-CM | POA: Diagnosis not present

## 2021-07-02 DIAGNOSIS — Z800 Family history of malignant neoplasm of digestive organs: Secondary | ICD-10-CM | POA: Insufficient documentation

## 2021-07-02 MED ORDER — TAMOXIFEN CITRATE 20 MG PO TABS: 20.0000 mg | ORAL_TABLET | Freq: Every day | ORAL | 3 refills | Status: DC

## 2021-07-02 NOTE — Progress Notes (Signed)
CLINIC:  Survivorship   Patient Care Team: Lorrene Reid, PA-C as PCP - General (Physician Assistant) Associates, [facility] Mcarthur Rossetti, MD as Consulting Physician (Orthopedic Surgery) Mauro Kaufmann, RN as Oncology Nurse Navigator Rockwell Germany, RN as Oncology Nurse Navigator Rolm Bookbinder, MD as Consulting Physician (General Surgery) Truitt Merle, MD as Consulting Physician (Hematology) Alla Feeling, NP as Nurse Practitioner (Nurse Practitioner) Gery Pray, MD as Consulting Physician (Radiation Oncology)   REASON FOR VISIT:  Routine follow-up post-treatment for a recent history of breast cancer.  BRIEF ONCOLOGIC HISTORY:  Oncology History Overview Note  Cancer Staging Malignant neoplasm of upper-outer quadrant of left breast in female, estrogen receptor positive ([place]) Staging form: Breast, AJCC 8th Edition - Clinical stage from 01/01/2021: Stage IA (cT1c, cN0, cM0, G2, ER+, PR+, HER2-) - Signed by Truitt Merle, MD on 01/20/2021 Stage prefix: Initial diagnosis    Malignant neoplasm of upper-outer quadrant of left breast in female, estrogen receptor positive ([place])  01/01/2021 Mammogram   Mammogram 01/01/21  IMPRESSION: 1. 11x7x8 mm mass in the 1 o'clock position of the left breast 8cmfn, highly suspicious for breast malignancy. No left axillary lymphadenopathy.   01/01/2021 Initial Biopsy   Diagnosis 01/01/21 Breast, left, needle core biopsy, 1 o'clock, upper outer quadrant, ribbon clip - INVASIVE MAMMARY CARCINOMA, GRADE II. - SEE MICROSCOPIC DESCRIPTION. Microscopic Comment The greatest tumor length is 0.9 cm. E-cadherin and breast prognostic profile will be performed. 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    01/01/2021 Receptors her2   PROGNOSTIC INDICATORS Results: IMMUNOHISTOCHEMICAL AND MORPHOMETRIC ANALYSIS PERFORMED MANUALLY The tumor cells are EQUIVOCAL for Her2 (2+). HER2 by FISH will be  performed and the results reported separately Estrogen Receptor: 95%, POSITIVE, STRONG STAINING INTENSITY Progesterone Receptor: 50%, POSITIVE, MODERATE STAINING INTENSITY Proliferation Marker Ki67: 20% FLUORESCENCE IN-SITU HYBRIDIZATION Results: GROUP 5: HER2 **NEGATIVE** Equivocal form of amplification of the HER2 gene was detected in the IHC 2+ tissue sample received from this individual. HER2 FISH was performed by a technologist and cell imaging and analysis on the [employer].   01/01/2021 Cancer Staging   Staging form: Breast, AJCC 8th Edition - Clinical stage from 01/01/2021: Stage IA (cT1c, cN0, cM0, G2, ER+, PR+, HER2-) - Signed by Truitt Merle, MD on 01/20/2021    01/16/2021 Initial Diagnosis   Malignant neoplasm of upper-outer quadrant of left breast in female, estrogen receptor positive ([place])   01/28/2021 Surgery   LEFT BREAST LUMPECTOMY WITH RADIOACTIVE SEED AND LEFT AXILLARY SENTINEL LYMPH NODE BIOPSY by Dr Donne Hazel    01/28/2021 Pathology Results   FINAL MICROSCOPIC DIAGNOSIS:   A. BREAST, LEFT W/SEED, LUMPECTOMY:  -  Invasive ductal carcinoma, Nottingham grade 2 of 3, 1.2 cm  -  Ductal carcinoma in-situ, intermediate grade  -  Calcifications associated with carcinoma  -  Margins uninvolved by carcinoma (see part B-E for final margin  status)  -  Previous biopsy site changes present  -  See oncology table below   B. BREAST, LEFT ADDITIONAL MEDIAL MARGIN, EXCISION:  -  No residual carcinoma identified   C. BREAST, LEFT ADDITIONAL POSTERIOR MARGIN, EXCISION:  -  No residual carcinoma identified   D. BREAST, LEFT ADDITIONAL LATERAL MARGIN, EXCISION:  -  No residual carcinoma   E. BREAST, LEFT ADDITIONAL SUPERIOR MARGIN, EXCISION:  -  No residual carcinoma identified  -  See comment   F. SENTINEL LYMPH NODE, LEFT AXILLARY, BIOPSY:  -  No carcinoma identified in one lymph node (0/1)  -  See comment   G. SENTINEL LYMPH NODE, LEFT AXILLARY, BIOPSY:  -  No carcinoma  identified in one lymph node (0/1)  -  See comment   H. SENTINEL LYMPH NODE, LEFT AXILLARY, BIOPSY:  -  No carcinoma identified in one lymph node (0/1)  -  See comment   COMMENT:   E.  There is cauterized epithelium which by immunohistochemistry  maintains a myoepithelial layer (positive for SMM and calponin).  Cytokeratin AE1/3 does not highlight an infiltrative epithelioid  component.   F-H.  Given the lobular-like morphology, cytokeratin AE1/3 was performed  on the lymph nodes to exclude isolated tumor cells in micrometastasis.  Cytokeratin AE1/3 is negative.   01/28/2021 Oncotype testing   Recurrence score 23 with distant recurrence risk of 9 years with AI/Tamoxifen at 9% She has less than 1% benefit of chemotherapy.    01/28/2021 Cancer Staging   Staging form: Breast, AJCC 8th Edition - Pathologic stage from 01/28/2021: Stage IA (pT1c, pN0, cM0, G2, ER+, PR+, HER2-, Oncotype DX score: 23) - Signed by Truitt Merle, MD on 04/03/2021  Stage prefix: Initial diagnosis  Multigene prognostic tests performed: Oncotype DX  Recurrence score range: Greater than or equal to 11  Histologic grading system: 3 grade system  Residual tumor (R): R0 - None    03/09/2021 - 04/06/2021 Radiation Therapy   Adjuvant radiation with Dr Sondra Come    03/2021 -  Anti-estrogen oral therapy   Tamoxifen 10m once daily starting in late April or Early May 2022.    07/02/2021 Survivorship   SCP delivered by LCira Rue NP      INTERVAL HISTORY:  [facility] meeting to review her survivorship care plan detailing her treatment course for breast cancer, as well as monitoring long-term side effects of that treatment, education regarding health maintenance, screening, and overall wellness and health promotion.     She feels well overall. Hot flashes are "overwhelming" but tolerable. LMP 10/2020. Has swelling in the left breast and some tugging in the left  shoulder occasionally. She tried PT but left feeling more uncomfortable or anxious. Wears compression bra. Denies concerns in her breast such as new lump/mass, nipple discharge or inversion. She does have a rash in the axilla that is improving with miconazole spray. Denies fever, chills. She is moody and stressed but she feels a normal level today's society. Denies any other specific concerns.     ONCOLOGY TREATMENT TEAM:  1. [facility] 2. Medical Oncologist: Dr. FBurr Medico 3. Radiation Oncologist: Dr. KSondra Come   PAST MEDICAL/SURGICAL HISTORY:  Past Medical History:  Diagnosis Date   Anxiety    History of radiation therapy 03/09/2021-04/06/2021   IMRT left breast    Dr JGery Pray  Medical history non-contributory    Past Surgical History:  Procedure Laterality Date   BREAST LUMPECTOMY WITH RADIOACTIVE SEED AND SENTINEL LYMPH NODE BIOPSY Left 01/28/2021   Procedure: LEFT BREAST LUMPECTOMY WITH RADIOACTIVE SEED AND LEFT AXILLARY SENTINEL LYMPH NODE BIOPSY;  Surgeon: WRolm Bookbinder MD;  [facility] [facility]  Service: General;  Laterality: Left;  PEC BLOCK   BREAST SURGERY Left    breast biopsy   EXCISION OF BREAST BIOPSY     NO PAST SURGERIES       ALLERGIES:  Allergies  Allergen Reactions   Latex Swelling and Rash     CURRENT MEDICATIONS:  Outpatient Encounter Medications as of  07/02/2021  Medication Sig   [DISCONTINUED] tamoxifen (NOLVADEX) 20 MG tablet Take 1 tablet (20 mg total) by mouth daily.   nystatin (MYCOSTATIN/NYSTOP) powder Apply 1 application topically 3 (three) times daily. To skin folder underneath breasts (Patient not taking: Reported on 07/02/2021)   nystatin-triamcinolone ointment (MYCOLOG) Apply 1 application topically 2 (two) times daily. (Patient not taking: Reported on 07/02/2021)   oxyCODONE (OXY IR/ROXICODONE) 5 MG immediate release tablet Take 1 tablet (5 mg total) by mouth every 6 (six) hours as needed  for severe pain. (Patient not taking: No sig reported)   tamoxifen (NOLVADEX) 20 MG tablet Take 1 tablet (20 mg total) by mouth daily.   No facility-administered encounter medications on file as of 07/02/2021.     ONCOLOGIC FAMILY HISTORY:  Family History  Problem Relation Age of Onset   Cancer Father        brain   Diabetes Brother    Cancer Maternal Grandmother        lung   Cancer Maternal Grandfather        brain and bone   Cancer Paternal Grandfather        brain     GENETIC COUNSELING/TESTING: No. We discussed this today, she will think about it.  SOCIAL HISTORY:  Dashana Guizar has a son. She lives in [place], [place].  She denies tobacco use and quit alcohol 2 years ago.    PHYSICAL EXAMINATION:  Vital Signs:   Vitals:   07/02/21 1237  BP: (!) 147/85  Pulse: 82  Resp: 18  Temp: 98.7 F (37.1 C)  SpO2: 100%   Filed Weights   07/02/21 1237  Weight: 153 lb 14.4 oz (69.8 kg)   General: Well-nourished, well-appearing female in no acute distress.   HEENT:   Sclerae anicteric.  Lymph: No cervical, supraclavicular, or infraclavicular lymphadenopathy noted on palpation.  Respiratory: breathing non-labored.  Neuro: No focal deficits. Steady gait.  Psych: Mood and affect normal and appropriate for situation.  Extremities: No edema. MSK: No focal spinal tenderness to palpation.  Full range of motion in bilateral upper extremities Skin: mildly erythematous rash to left outer axilla  Breast: pendulous breasts without nipple discharge or inversion. S/p left lumpectomy and radiation, incisions completely healed. Mild to moderate left breast LE. No palpable mass in either breast or axilla that I could appreciate.   LABORATORY DATA:  None for this visit.  DIAGNOSTIC IMAGING:  None for this visit.      ASSESSMENT AND PLAN:  Ms.. Germany is a pleasant 50 y.o. female with Stage IA left breast invasive ductal carcinoma, ER+/PR+/HER2-, diagnosed in 12/2020, treated with  lumpectomy, adjuvant radiation therapy, and anti-estrogen therapy with tamoxifen beginning in 03/2021.  She presents to the Survivorship Clinic for our initial meeting and routine follow-up post-completion of treatment for breast cancer.    1. Stage IA left breast cancer:  Ms. Ditto is continuing to recover from definitive treatment for breast cancer. She has left breast edema, I have referred her for second opinion PT. She will follow-up with her medical oncologist, Dr. Burr Medico in 09/2021 with history and physical exam per surveillance protocol.  She will continue her anti-estrogen therapy with Tamoxifen. Thus far, she is tolerating well, with minimal side effects, mainly moderate hot flashes which she is willing to tolerate. She was instructed to make Dr. Burr Medico or myself aware if she begins to experience any worsening side effects of the medication and I could see her back in clinic to help manage those  side effects, as needed. Though the incidence is low, there is an associated risk of endometrial cancer with anti-estrogen therapies like Tamoxifen.  Ms. Scrima was encouraged to contact Dr. Burr Medico or myself with any vaginal bleeding while taking Tamoxifen. Other side effects of Tamoxifen were again reviewed with her as well. Today, a comprehensive survivorship care plan and treatment summary was reviewed with the patient today detailing her breast cancer diagnosis, treatment course, potential late/long-term effects of treatment, appropriate follow-up care with recommendations for the future, and patient education resources.  A copy of this summary, along with a letter will be sent to the patient's primary care provider via In [employer] message after today's visit.    2. Bone health:  Given Ms. Bonsall's age, perimenopausal status, and tamoxifen, she does not need DEXA yet. I encouraged her to take daily calcium and vitamin D, and enegage in weight bearing exercise to help bone strength. We also reviewed the bone  strengthening quality of tamoxifen.   3. Cancer screening:  Due to Ms. Mcmeen's history and her age, she should receive screening for skin cancers, colon cancer, and gynecologic cancers.  The information and recommendations are listed on the patient's comprehensive care plan/treatment summary and were reviewed in detail with the patient.    4. Health maintenance and wellness promotion: Ms. Bogie was encouraged to consume 5-7 servings of fruits and vegetables per day. We reviewed the "Nutrition Rainbow" handout, as well as the handout "Take Control of Your Health and Reduce Your Cancer Risk" from the [employer].  She was also encouraged to engage in moderate to vigorous exercise for 30 minutes per day most days of the week. We discussed the [employer] fitness program, which is designed for cancer survivors to help them become more physically fit after cancer treatments.  She was instructed to limit her alcohol consumption and continue to abstain from tobacco use.     5. Support services/counseling: It is not uncommon for this period of the patient's cancer care trajectory to be one of many emotions and stressors.  We discussed an opportunity for her to participate in the next session of [facility] ("Finding Your New Normal") support group series designed for patients after they have completed treatment.   Ms. Meaney was encouraged to take advantage of our many other support services programs, support groups, and/or counseling in coping with her new life as a cancer survivor after completing anti-cancer treatment.  She was offered support today through active listening and expressive supportive counseling.  She was given information regarding our available services and encouraged to contact me with any questions or for help enrolling in any of our support group/programs.    Dispo:   -Return to cancer center 10/02/21  -Mammogram due in 12/2021 -Follow up with surgery as scheduled -referral to GI  for first screening colonoscopy -referral to PT for LE -She is welcome to return back to the Survivorship Clinic at any time; no additional follow-up needed at this time.  -Consider referral back to survivorship as a long-term survivor for continued surveillance  A total of (30) minutes of face-to-face time was spent with this patient with greater than 50% of that time in counseling and care-coordination.   [place] [phone]   Note: PRIMARY CARE PROVIDER Lorrene Reid, [place] [phone]

## 2021-07-03 DIAGNOSIS — C50919 Malignant neoplasm of unspecified site of unspecified female breast: Secondary | ICD-10-CM | POA: Insufficient documentation

## 2021-07-07 NOTE — Progress Notes (Incomplete)
  Radiation Oncology         [phone] ________________________________  Patient Name: Cindy [name] MRN: [id number] DOB: Nov 23, 1971 Referring Physician: Rolm Bookbinder (Profile Not Attached) Date of Service: 04/06/2021 [facility], [place]  End Of Treatment Note  Diagnoses: C50.412-Malignant neoplasm of upper-outer quadrant of left female breast  Cancer Staging: Stage IA (pT1c, pN0) Left Breast UOQ, Invasive Ductal Carcinoma with intermediate grade DCIS, ER+ / PR+ / Her2-, Grade 2   Intent: Curative  Radiation Treatment Dates: 03/09/2021 through 04/06/2021 Site Technique Total Dose (Gy) Dose per Fx (Gy) Completed Fx Beam Energies  Breast, Left: Breast_Lt 3D 40.05/40.05 2.67 15/15 6X, 10X  Breast, Left: Breast_Lt_Bst 3D 10/10 2 5/5 6X, 10X   Narrative: The patient reports pain 6/10 underneath left breast and left axilla with stabbing pains. She also reports having increased redness to left breast and well as consistent fatigue. She using Radiaplex twice daily. She additionally reports increased redness to left breast and axilla with itching and burning and tenderness, as well as redness underneath the left breast. Otherwise, she reports having a healthy appetite and no new falls.  No change in swelling of the breast was seen, otherwise her skin remains intact.   Plan: The patient will follow-up with radiation oncology in one month .  ________________________________________________ -----------------------------------  Blair Promise, PhD, MD  This document serves as a record of services personally performed by Gery Pray, MD. It was created on his behalf by Roney Mans, a trained medical scribe. The creation of this record is based on the scribe's personal observations and the provider's statements to them. This document has been checked and approved by the attending provider.

## 2021-07-08 NOTE — Progress Notes (Signed)
Radiation Oncology         [phone] ________________________________  Name: Cindy [name] MRN: [id number]  Date: 07/09/2021  DOB: 10-13-1971  Follow-Up Visit Note  CC: Lorrene Reid, Serena Colonel, MD    ICD-10-CM   1. Malignant neoplasm of upper-outer quadrant of left breast in female, estrogen receptor positive ([place])  C50.412    Z17.0       Diagnosis:  Stage IA (pT1c, pN0) Left Breast UOQ, Invasive Ductal Carcinoma with intermediate grade DCIS, ER+ / PR+ / Her2-, Grade 2  Interval Since Last Radiation:  3 months and 3 days   Intent: Curative  Radiation Treatment Dates: 03/09/2021 through 04/06/2021 Site Technique Total Dose (Gy) Dose per Fx (Gy) Completed Fx Beam Energies  Breast, Left: Breast_Lt 3D 40.05/40.05 2.67 15/15 6X, 10X  Breast, Left: Breast_Lt_Bst 3D 10/10 2 5/5 6X, 10X   Narrative:  The patient returns today for routine 2 month follow-up. Since she was last seen, the patient presented to Cira Rue NP on 07/02/21 for the Survivorship Clinic to review her survivorship care plan detailing her treatment course for breast cancer. She was noted to feel well overall other than hot flashes.   The patient is still currently attending outpatient rehab at [facility]. She is noted to be tolerating her treatment well.   She would like to change locations for her physical therapy and spoke to Silvio Clayman concerning this issue at survivorship appointment.  She continues to have some discomfort within the breast but no pain.  She denies any nipple discharge or bleeding.  She denies any problems with swelling in her left arm or hand.  She is on tamoxifen and has had some hot flashes with this medication but this appears to be manageable.  Occasionally she will be awoken with a hot flash at night.          Allergies:  is allergic to latex.  Meds: Current Outpatient Medications  Medication Sig Dispense Refill   nystatin (MYCOSTATIN/NYSTOP) powder Apply  1 application topically 3 (three) times daily. To skin folder underneath breasts 30 g 1   nystatin-triamcinolone ointment (MYCOLOG) Apply 1 application topically 2 (two) times daily. 30 g 0   tamoxifen (NOLVADEX) 20 MG tablet Take 1 tablet (20 mg total) by mouth daily. 90 tablet 3   oxyCODONE (OXY IR/ROXICODONE) 5 MG immediate release tablet Take 1 tablet (5 mg total) by mouth every 6 (six) hours as needed for severe pain. (Patient not taking: No sig reported) 10 tablet 0   No current facility-administered medications for this encounter.    Physical Findings: The patient is in no acute distress. Patient is alert and oriented.  height is '5\' 2"'  (1.575 m) and weight is 152 lb 8 oz (69.2 kg). Her temporal temperature is 97 F (36.1 C) (abnormal). Her blood pressure is 151/90 (abnormal) and her pulse is 83. Her respiration is 18 and oxygen saturation is 100%. .  No significant changes. Lungs are clear to auscultation bilaterally. Heart has regular rate and rhythm. No palpable cervical, supraclavicular, or axillary adenopathy. Abdomen soft, non-tender, normal bowel sounds.   Right Breast: no palpable mass, nipple discharge or bleeding.  Large and pendulous. Left breast large and pendulous.  She continues to have some lymphedema within the breast centrally but improved since my last examination.  No nipple discharge or bleeding.  No palpable mass within the breast.  She reports some tenderness in the lateral aspect of the breast area with exam  but no real pain.   Lab Findings: Lab Results  Component Value Date   WBC 4.6 04/03/2021   HGB 13.4 04/03/2021   HCT 40.0 04/03/2021   MCV 90.5 04/03/2021   PLT 221 04/03/2021    Radiographic Findings: No results found.  Impression:  Stage IA (pT1c, pN0) Left Breast UOQ, Invasive Ductal Carcinoma with intermediate grade DCIS, ER+ / PR+ / Her2-, Grade 2  The patient is recovering from the effects of radiation.  Skin has healed well at this time.  She  continues to have some lymphedema within the breast.  She will continue on physical therapy concerning this issue.  Plan: Light of her persistent lymphedema issues within the breast she will return for routine follow-up in radiation oncology in 3 months.   15 minutes of total time was spent for this patient encounter, including preparation, face-to-face counseling with the patient and coordination of care, physical exam, and documentation of the encounter. ____________________________________  Blair Promise, PhD, MD   This document serves as a record of services personally performed by Gery Pray, MD. It was created on his behalf by Roney Mans, a trained medical scribe. The creation of this record is based on the scribe's personal observations and the provider's statements to them. This document has been checked and approved by the attending provider.

## 2021-07-09 ENCOUNTER — Ambulatory Visit
Admission: RE | Admit: 2021-07-09 | Discharge: 2021-07-09 | Disposition: A | Discharge status: Home / Self Care | Payer: Managed Care, Other (non HMO) | Source: Ambulatory Visit | Attending: Radiation Oncology | Admitting: Radiation Oncology

## 2021-07-09 ENCOUNTER — Other Ambulatory Visit: Payer: Self-pay

## 2021-07-09 ENCOUNTER — Encounter: Payer: Self-pay | Admitting: Radiation Oncology

## 2021-07-09 DIAGNOSIS — Z170 Estrogen receptor positive status [ER+]: Secondary | ICD-10-CM | POA: Diagnosis not present

## 2021-07-09 DIAGNOSIS — C50412 Malignant neoplasm of upper-outer quadrant of left female breast: Secondary | ICD-10-CM | POA: Diagnosis present

## 2021-07-09 DIAGNOSIS — R232 Flushing: Secondary | ICD-10-CM | POA: Insufficient documentation

## 2021-07-09 DIAGNOSIS — Z79899 Other long term (current) drug therapy: Secondary | ICD-10-CM | POA: Insufficient documentation

## 2021-07-09 DIAGNOSIS — Z923 Personal history of irradiation: Secondary | ICD-10-CM | POA: Diagnosis not present

## 2021-07-09 NOTE — Progress Notes (Signed)
Cindy [name] is here today for follow up post radiation to the breast.   Breast Side:left   They completed their radiation on: 04/06/21  Does the patient complain of any of the following: Post radiation skin issues:  Skin has improved. Continues to have some hyperpigmentation. Breast Tenderness: yes Breast Swelling: yes Lymphadema: no Range of Motion limitations: full with tightness to left arm. Patient waiting on new PT referral.  Fatigue post radiation: Continues to have mild fatigue.  Appetite good/fair/poor: good  Additional comments if applicable:   Vitals:   07/09/21 1050  BP: (!) 151/90  Pulse: 83  Resp: 18  Temp: (!) 97 F (36.1 C)  TempSrc: Temporal  SpO2: 100%  Weight: 152 lb 8 oz (69.2 kg)  Height: 5\' 2"  (1.575 m)

## 2021-07-30 ENCOUNTER — Telehealth: Payer: Self-pay

## 2021-07-30 DIAGNOSIS — C50412 Malignant neoplasm of upper-outer quadrant of left female breast: Secondary | ICD-10-CM

## 2021-07-30 DIAGNOSIS — Z1211 Encounter for screening for malignant neoplasm of colon: Secondary | ICD-10-CM

## 2021-07-30 NOTE — Telephone Encounter (Signed)
This nurse reached out to patient related to Gastroenterology and Physical Therapy referrals.  Patient states that she has attempted to reach [facility] for weeks and no one picks up the phone.  Patient states that she would like to be referred to [facility] instead.  Patient also states that she has not heard from anyone concerning PT either however she would like to request a different facility then her pervious facility which was [facility] on [address].  I advised patient that I will submit her referral to [facility] and look into a different PT facility.  No further questions or concerns at this time.

## 2021-08-03 ENCOUNTER — Ambulatory Visit: Payer: Managed Care, Other (non HMO) | Attending: General Surgery

## 2021-08-03 ENCOUNTER — Other Ambulatory Visit: Payer: Self-pay

## 2021-08-03 VITALS — Wt 152.2 lb

## 2021-08-03 DIAGNOSIS — Z483 Aftercare following surgery for neoplasm: Secondary | ICD-10-CM | POA: Insufficient documentation
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
## 2021-10-02 ENCOUNTER — Telehealth: Payer: Self-pay | Admitting: Hematology

## 2021-10-02 ENCOUNTER — Telehealth: Payer: Self-pay

## 2021-10-02 ENCOUNTER — Inpatient Hospital Stay: Payer: Managed Care, Other (non HMO) | Attending: Hematology | Admitting: Hematology

## 2021-10-02 ENCOUNTER — Encounter: Payer: Self-pay | Admitting: Physical Therapy

## 2021-10-02 ENCOUNTER — Inpatient Hospital Stay: Payer: Managed Care, Other (non HMO)

## 2021-10-02 ENCOUNTER — Other Ambulatory Visit: Payer: Self-pay

## 2021-10-02 ENCOUNTER — Encounter: Payer: Self-pay | Admitting: Hematology

## 2021-10-02 VITALS — BP 152/81 | HR 80 | Temp 98.2°F | Resp 19 | Ht 62.0 in | Wt 154.2 lb

## 2021-10-02 DIAGNOSIS — Z1211 Encounter for screening for malignant neoplasm of colon: Secondary | ICD-10-CM

## 2021-10-02 DIAGNOSIS — Z170 Estrogen receptor positive status [ER+]: Secondary | ICD-10-CM | POA: Insufficient documentation

## 2021-10-02 DIAGNOSIS — C50412 Malignant neoplasm of upper-outer quadrant of left female breast: Secondary | ICD-10-CM

## 2021-10-02 DIAGNOSIS — F419 Anxiety disorder, unspecified: Secondary | ICD-10-CM | POA: Insufficient documentation

## 2021-10-02 DIAGNOSIS — R232 Flushing: Secondary | ICD-10-CM | POA: Insufficient documentation

## 2021-10-02 DIAGNOSIS — Z923 Personal history of irradiation: Secondary | ICD-10-CM | POA: Insufficient documentation

## 2021-10-02 LAB — CMP (CANCER CENTER ONLY)
ALT: 7 U/L (ref 0–44)
AST: 13 U/L — ABNORMAL LOW (ref 15–41)
Albumin: 4.4 g/dL (ref 3.5–5.0)
Alkaline Phosphatase: 60 U/L (ref 38–126)
Anion gap: 10 (ref 5–15)
BUN: 7 mg/dL (ref 6–20)
CO2: 26 mmol/L (ref 22–32)
Calcium: 9.6 mg/dL (ref 8.9–10.3)
Chloride: 103 mmol/L (ref 98–111)
Creatinine: 0.8 mg/dL (ref 0.44–1.00)
GFR, Estimated: >60 mL/min (ref >60)
Glucose, Bld: 99 mg/dL (ref 70–99)
Potassium: 3.9 mmol/L (ref 3.5–5.1)
Sodium: 139 mmol/L (ref 135–145)
Total Bilirubin: 1.1 mg/dL (ref 0.3–1.2)
Total Protein: 7.6 g/dL (ref 6.5–8.1)

## 2021-10-02 LAB — CBC WITH DIFFERENTIAL (CANCER CENTER ONLY)
Abs Immature Granulocytes: 0 10*3/uL (ref 0.00–0.07)
Basophils Absolute: 0.1 10*3/uL (ref 0.0–0.1)
Basophils Relative: 1 %
Eosinophils Absolute: 0.1 10*3/uL (ref 0.0–0.5)
Eosinophils Relative: 2 %
HCT: 37.5 % (ref 36.0–46.0)
Hemoglobin: 12.5 g/dL (ref 12.0–15.0)
Immature Granulocytes: 0 %
Lymphocytes Relative: 23 %
Lymphs Abs: 0.9 10*3/uL (ref 0.7–4.0)
MCH: 30.5 pg (ref 26.0–34.0)
MCHC: 33.3 g/dL (ref 30.0–36.0)
MCV: 91.5 fL (ref 80.0–100.0)
Monocytes Absolute: 0.4 10*3/uL (ref 0.1–1.0)
Monocytes Relative: 10 %
Neutro Abs: 2.3 10*3/uL (ref 1.7–7.7)
Neutrophils Relative %: 64 %
Platelet Count: 218 10*3/uL (ref 150–400)
RBC: 4.1 MIL/uL (ref 3.87–5.11)
RDW: 11.7 % (ref 11.5–15.5)
WBC Count: 3.7 10*3/uL — ABNORMAL LOW (ref 4.0–10.5)
nRBC: 0 % (ref 0.0–0.2)

## 2021-10-02 MED ORDER — VENLAFAXINE HCL ER 75 MG PO CP24: 75.0000 mg | ORAL_CAPSULE | Freq: Every day | ORAL | 1 refills | Status: DC

## 2021-10-02 NOTE — Telephone Encounter (Signed)
Called pt to schedule screening colon as requested by Dr. Lorenso Courier and Dr. Burr Medico. Amb referral placed for auth purposes.  Aleatha Borer, LPN  Sharyn Creamer, MD Cc: Truitt Merle, MD Pre-visit and Colon scheduled as follows:   Appointment Information  Name: Cindy [name], Cindy [name] MRN: [id number]  Date: 11/27/2021 Status: Sch  Time: 11:00 AM Length: 30  Visit Type: PRE OP 30 WQ [1222] Copay: $0.00  Provider: [facility] Department: [facility]  Referring Provider: Lorrene Reid CSN: [id number]  Notes: Virtual; call [phone]; Screening Colon 12/01/21 [place] @ 1030am w/ Dr. Lorenso Courier; no BT/ae  Made On:  Change Notes: 10/02/2021 3:10 PM  10/02/2021 3:11 PM By:  By: Cheral Bay, [place]   Appointment Information  Name: [name], [name] MRN: [id number]  Date: 12/01/2021 Status: Sch  Time: 10:30 AM Length: 30  Visit Type: PROPOFOL COLON [1272] Copay: $0.00  Provider: Sharyn Creamer, MD Department: [facility]  Referring Provider: Lorrene Reid CSN: [id number]  Notes: CRC screening/ae  Made On:  Change Notes: 10/02/2021 3:08 PM  10/02/2021 3:12 PM By:  By: Cheral Bay, Samiksha Pellicano J        Previous Messages   ----- Message -----  From: Sharyn Creamer, MD  Sent: 10/02/2021  11:59 AM EDT  To: Truitt Merle, MD, Aleatha Borer, LPN   Hi Preslei Blakley,   Could we get this patient scheduled for a nurse pre-visit or a clinic visit for a screening colonoscopy in [place] with me?   Thanks,  Lyndee Leo  ----- Message -----  From: Truitt Merle, MD  Sent: 10/02/2021  11:04 AM EDT  To: Estella Husk, LPN, Sharyn Creamer, MD   Hi Dr. Lorenso Courier,   Do you take one pts? We referred this pt to [facility] for screening colonoscopy a few months ago but pt still does not have an appointment. She called, my nurse called, we have trouble to get hold of the schedulers at [place].   Could you help to get her appointment scheduled?   Thanks much   [employer]

## 2021-10-02 NOTE — Progress Notes (Addendum)
[place]   Telephone:([phone]) [phone] Fax:(336) [phone]   Clinic Follow up Note   Patient Care Team: Lorrene Reid, PA-C as PCP - [facility], [facility] Cindy Rossetti, MD as Consulting Physician (Orthopedic Surgery) Cindy Kaufmann, RN as Oncology Nurse Navigator Rockwell Germany, RN as Oncology Nurse Navigator Cindy Bookbinder, MD as Consulting Physician (General Surgery) Truitt Merle, MD as Consulting Physician (Hematology) Cindy Feeling, NP as Nurse Practitioner (Nurse Practitioner) Cindy Pray, MD as Consulting Physician (Radiation Oncology)  Date of Service:  10/02/2021  CHIEF COMPLAINT: f/u of left breast cancer  CURRENT THERAPY:  Tamoxifen 46m once daily starting in late April or Early May 2022.  ASSESSMENT & PLAN:  Cindy Moffatis a 50y.o. female with   1. Malignant neoplasm of upper-outer quadrant of left breast, Stage IA, pT1cN0M0, ER+/PR+/HER2-, Grade II, RS 23 -discovered on screening mammogram. 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 -Left lumpectomy with SLNB by Dr WDonne Hazelon 01/28/21, path showed 1.2cm IDC, grade 2 and intermediate grade DCIS. Margins and LN negative. RS of 23, distant recurrence risk of 9%. -adjuvant radiation with Dr KSondra Come3/14/22-4/11/22.  -she began tamoxifen in late April/early May 2022. She is tolerating moderately well with bothersome hot flashes. We will check her FRainelleat her next visit. -she continues to have left breast tenderness and swelling. She tried PT but did not match with the therapists. I will refer her directly to MRaeanne Gathers the head of the PT department there. She is willing to restart PT.    2. Anxiety, hot flashes -Not on medication.  -She has been having trouble sleeping recently.  -she also reports intense hot flashes from the tamoxifen. I offered to start her on Effexor, which will help her hot flashes and her anxiety. She would like to try.      PLAN:   -I prescribed Effexor today for her hot flushes  -referral to MRaeanne Gathersin PT. I reached out to her today. -continue tamoxifen -annual mammogram in 12/2021 -lab and f/u with NP Lacie in 6 months, she will see her GYN in Dec 2022 -[place]    No problem-specific Assessment & Plan notes found for this encounter.   SUMMARY OF ONCOLOGIC HISTORY: Oncology History Overview Note  Cancer Staging Malignant neoplasm of upper-outer quadrant of left breast in female, estrogen receptor positive ([place] Staging form: Breast, AJCC 8th Edition - Clinical stage from 01/01/2021: Stage IA (cT1c, cN0, cM0, G2, ER+, PR+, HER2-) - Signed by FTruitt Merle MD on 01/20/2021 Stage prefix: Initial diagnosis    Malignant neoplasm of upper-outer quadrant of left breast in female, estrogen receptor positive ([place]  01/01/2021 Mammogram   Mammogram 01/01/21  IMPRESSION: 1. 11x7x8 mm mass in the 1 o'clock position of the left breast 8cmfn, highly suspicious for breast malignancy. No left axillary lymphadenopathy.   01/01/2021 Initial Biopsy   Diagnosis 01/01/21 Breast, left, needle core biopsy, 1 o'clock, upper outer quadrant, ribbon clip - INVASIVE MAMMARY CARCINOMA, GRADE II. - SEE MICROSCOPIC DESCRIPTION. Microscopic Comment The greatest tumor length is 0.9 cm. E-cadherin and breast prognostic profile will be performed. 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    01/01/2021 Receptors her2   PROGNOSTIC INDICATORS Results: IMMUNOHISTOCHEMICAL AND MORPHOMETRIC ANALYSIS PERFORMED MANUALLY The tumor cells are EQUIVOCAL for Her2 (2+). HER2 by FISH will be performed and the results reported separately Estrogen Receptor: 95%, POSITIVE, STRONG STAINING INTENSITY Progesterone Receptor:  50%, POSITIVE, MODERATE STAINING INTENSITY Proliferation Marker Ki67: 20% FLUORESCENCE IN-SITU HYBRIDIZATION Results: GROUP 5: HER2  **NEGATIVE** Equivocal form of amplification of the HER2 gene was detected in the IHC 2+ tissue sample received from this individual. HER2 FISH was performed by a technologist and cell imaging and analysis on the BioView.   01/01/2021 Cancer Staging   Staging form: Breast, AJCC 8th Edition - Clinical stage from 01/01/2021: Stage IA (cT1c, cN0, cM0, G2, ER+, PR+, HER2-) - Signed by Truitt Merle, MD on 01/20/2021   01/16/2021 Initial Diagnosis   Malignant neoplasm of upper-outer quadrant of left breast in female, estrogen receptor positive ([place])   01/28/2021 Surgery   LEFT BREAST LUMPECTOMY WITH RADIOACTIVE SEED AND LEFT AXILLARY SENTINEL LYMPH NODE BIOPSY by Dr Donne Hazel    01/28/2021 Pathology Results   FINAL MICROSCOPIC DIAGNOSIS:   A. BREAST, LEFT W/SEED, LUMPECTOMY:  -  Invasive ductal carcinoma, Nottingham grade 2 of 3, 1.2 cm  -  Ductal carcinoma in-situ, intermediate grade  -  Calcifications associated with carcinoma  -  Margins uninvolved by carcinoma (see part B-E for final margin  status)  -  Previous biopsy site changes present  -  See oncology table below   B. BREAST, LEFT ADDITIONAL MEDIAL MARGIN, EXCISION:  -  No residual carcinoma identified   C. BREAST, LEFT ADDITIONAL POSTERIOR MARGIN, EXCISION:  -  No residual carcinoma identified   D. BREAST, LEFT ADDITIONAL LATERAL MARGIN, EXCISION:  -  No residual carcinoma   E. BREAST, LEFT ADDITIONAL SUPERIOR MARGIN, EXCISION:  -  No residual carcinoma identified  -  See comment   F. SENTINEL LYMPH NODE, LEFT AXILLARY, BIOPSY:  -  No carcinoma identified in one lymph node (0/1)  -  See comment   G. SENTINEL LYMPH NODE, LEFT AXILLARY, BIOPSY:  -  No carcinoma identified in one lymph node (0/1)  -  See comment   H. SENTINEL LYMPH NODE, LEFT AXILLARY, BIOPSY:  -  No carcinoma identified in one lymph node (0/1)  -  See comment   COMMENT:   E.  There is cauterized epithelium which by immunohistochemistry  maintains a  myoepithelial layer (positive for SMM and calponin).  Cytokeratin AE1/3 does not highlight an infiltrative epithelioid  component.   F-H.  Given the lobular-like morphology, cytokeratin AE1/3 was performed  on the lymph nodes to exclude isolated tumor cells in micrometastasis.  Cytokeratin AE1/3 is negative.   01/28/2021 Oncotype testing   Recurrence score 23 with distant recurrence risk of 9 years with AI/Tamoxifen at 9% She has less than 1% benefit of chemotherapy.    01/28/2021 Cancer Staging   Staging form: Breast, AJCC 8th Edition - Pathologic stage from 01/28/2021: Stage IA (pT1c, pN0, cM0, G2, ER+, PR+, HER2-, Oncotype DX score: 23) - Signed by Truitt Merle, MD on 04/03/2021 Stage prefix: Initial diagnosis Multigene prognostic tests performed: Oncotype DX Recurrence score range: Greater than or equal to 11 Histologic grading system: 3 grade system Residual tumor (R): R0 - None   03/09/2021 - 04/06/2021 Radiation Therapy   Adjuvant radiation with Dr Sondra Come    03/2021 -  Anti-estrogen oral therapy   Tamoxifen 74m once daily starting in late April or Early May 2022.    07/02/2021 Survivorship   SCP delivered by LCira Rue NP       INTERVAL HISTORY:  DElane Peabodyis here for a follow up of breast cancer. She was last seen by me on 04/03/21 and in the survivorship clinic in the  interim. She presents to the clinic alone. She reports she is still having some tenderness to the breast. She notes she went to PT briefly but felt it was not helpful and not a good fit. She wonders if she can go to a different provider/group. She also reports some twinges in her right breast, which concerns her. I reassured her that her physical exam was benign.   All other systems were reviewed with the patient and are negative.  MEDICAL HISTORY:  Past Medical History:  Diagnosis Date   Anxiety    History of radiation therapy 03/09/2021-04/06/2021   IMRT left breast    Dr Cindy [name]   Medical history  non-contributory     SURGICAL HISTORY: Past Surgical History:  Procedure Laterality Date   BREAST LUMPECTOMY WITH RADIOACTIVE SEED AND SENTINEL LYMPH NODE BIOPSY Left 01/28/2021   Procedure: LEFT BREAST LUMPECTOMY WITH RADIOACTIVE SEED AND LEFT AXILLARY SENTINEL LYMPH NODE BIOPSY;  Surgeon: Cindy Bookbinder, MD;  Location: [facility];  Service: General;  Laterality: Left;  PEC BLOCK   BREAST SURGERY Left    breast biopsy   EXCISION OF BREAST BIOPSY     NO PAST SURGERIES      I have reviewed the social history and family history with the patient and they are unchanged from previous note.  ALLERGIES:  is allergic to latex.  MEDICATIONS:  Current Outpatient Medications  Medication Sig Dispense Refill   venlafaxine XR (EFFEXOR-XR) 75 MG 24 hr capsule Take 1 capsule (75 mg total) by mouth daily with breakfast. 30 capsule 1   nystatin (MYCOSTATIN/NYSTOP) powder Apply 1 application topically 3 (three) times daily. To skin folder underneath breasts 30 g 1   nystatin-triamcinolone ointment (MYCOLOG) Apply 1 application topically 2 (two) times daily. 30 g 0   oxyCODONE (OXY IR/ROXICODONE) 5 MG immediate release tablet Take 1 tablet (5 mg total) by mouth every 6 (six) hours as needed for severe pain. (Patient not taking: No sig reported) 10 tablet 0   tamoxifen (NOLVADEX) 20 MG tablet Take 1 tablet (20 mg total) by mouth daily. 90 tablet 3   No current facility-administered medications for this visit.    PHYSICAL EXAMINATION: ECOG PERFORMANCE STATUS: 0 - Asymptomatic  Vitals:   10/02/21 0902  BP: (!) 152/81  Pulse: 80  Resp: 19  Temp: 98.2 F (36.8 C)  SpO2: 100%   Wt Readings from Last 3 Encounters:  10/02/21 154 lb 3.2 oz (69.9 kg)  08/03/21 152 lb 4 oz (69.1 kg)  07/09/21 152 lb 8 oz (69.2 kg)     GENERAL:alert, no distress and comfortable SKIN: skin color, texture, turgor are normal, no rashes or significant lesions EYES: normal, Conjunctiva are pink and  non-injected, sclera clear  NECK: supple, thyroid normal size, non-tender, without nodularity LYMPH:  no palpable lymphadenopathy in the cervical, axillary  LUNGS: clear to auscultation and percussion with normal breathing effort HEART: regular rate & rhythm and no murmurs and no lower extremity edema Musculoskeletal:no cyanosis of digits and no clubbing  NEURO: alert & oriented x 3 with fluent speech, no focal motor/sensory deficits BREAST: she still has swelling and tenderness to her left breast. No palpable mass, nodules or adenopathy bilaterally. Breast exam benign.   LABORATORY DATA:  I have reviewed the data as listed CBC Latest Ref Rng & Units 10/02/2021 04/03/2021 07/03/2019  WBC 4.0 - 10.5 K/uL 3.7(L) 4.6 4.6  Hemoglobin 12.0 - 15.0 g/dL 12.5 13.4 14.1  Hematocrit 36.0 - 46.0 % 37.5  40.0 41.1  Platelets 150 - 400 K/uL 218 221 317     CMP Latest Ref Rng & Units 10/02/2021 04/03/2021 07/03/2019  Glucose 70 - 99 mg/dL 99 95 96  BUN 6 - 20 mg/dL '7 9 6  ' Creatinine 0.44 - 1.00 mg/dL 0.80 0.83 0.70  Sodium 135 - 145 mmol/L 139 141 137  Potassium 3.5 - 5.1 mmol/L 3.9 4.3 4.1  Chloride 98 - 111 mmol/L 103 104 97  CO2 22 - 32 mmol/L '26 25 21  ' Calcium 8.9 - 10.3 mg/dL 9.6 9.3 9.8  Total Protein 6.5 - 8.1 g/dL 7.6 7.9 7.6  Total Bilirubin 0.3 - 1.2 mg/dL 1.1 1.1 0.6  Alkaline Phos 38 - 126 U/L 60 81 59  AST 15 - 41 U/L 13(L) 12(L) 12  ALT 0 - 44 U/L '7 7 7      ' RADIOGRAPHIC STUDIES: I have personally reviewed the radiological images as listed and agreed with the findings in the report. No results found.    Orders Placed This Encounter  Procedures   FSH-Follicle stimulating hormone    Standing Status:   Future    Standing Expiration Date:   10/02/2022   All questions were answered. The patient knows to call the clinic with any problems, questions or concerns. No barriers to learning was detected. The total time spent in the appointment was 30 minutes.     Truitt Merle, MD 10/02/2021    I, Wilburn Mylar, am acting as scribe for Truitt Merle, MD.   I have reviewed the above documentation for accuracy and completeness, and I agree with the above.

## 2021-10-02 NOTE — Telephone Encounter (Signed)
Scheduled follow-up appointment per 10/7 los. Patient is aware.

## 2021-10-07 ENCOUNTER — Encounter: Payer: Self-pay | Admitting: Radiology

## 2021-10-10 NOTE — Progress Notes (Signed)
Radiation Oncology         [phone] ________________________________  Name: Cindy [name] MRN: [id number]  Date: 10/12/2021  DOB: [birth year]  Follow-Up Visit Note  CC: Lorrene Reid, Serena Colonel, MD    ICD-10-CM   1. Malignant neoplasm of upper-outer quadrant of left breast in female, estrogen receptor positive ([place])  C50.412    Z17.0       Diagnosis: Stage IA (pT1c, pN0) Left Breast UOQ, Invasive Ductal Carcinoma with intermediate grade DCIS, ER+ / PR+ / Her2-, Grade 2  Interval Since Last Radiation: 6 months and 6 days   Intent: Curative  Radiation Treatment Dates: 03/09/2021 through 04/06/2021 Site Technique Total Dose (Gy) Dose per Fx (Gy) Completed Fx Beam Energies  Breast, Left: Breast_Lt 3D 40.05/40.05 2.67 15/15 6X, 10X  Breast, Left: Breast_Lt_Bst 3D 10/10 2 5/5 6X, 10X    Narrative:  The patient returns today for routine follow-up, she was last seen here for follow-up on 07/09/21.               Since her last visit, the patient followed up with Dr. Burr Medico on 10/02/21. During which time, the patient reported some tenderness to her left breast and twinges to her right breast. The patient noted that PT was not helpful to her and requested potentially trying a different PT provider/group. Physical exam performed during this visit indicated swelling and tenderness to her left breast ;findings were otherwise benign in both breasts.     She is on tamoxifen and having some hot flashes related to this medication.  She denies any nipple discharge or bleeding from either breast.        Allergies:  is allergic to latex.  Meds: Current Outpatient Medications  Medication Sig Dispense Refill   acetaminophen (TYLENOL) 500 MG tablet Take 500 mg by mouth as needed.     Ibuprofen 200 MG CAPS Take 400 mg by mouth as needed.     tamoxifen (NOLVADEX) 20 MG tablet Take 1 tablet (20 mg total) by mouth daily. 90 tablet 3   nystatin (MYCOSTATIN/NYSTOP) powder Apply 1  application topically 3 (three) times daily. To skin folder underneath breasts (Patient not taking: Reported on 10/12/2021) 30 g 1   nystatin-triamcinolone ointment (MYCOLOG) Apply 1 application topically 2 (two) times daily. (Patient not taking: Reported on 10/12/2021) 30 g 0   oxyCODONE (OXY IR/ROXICODONE) 5 MG immediate release tablet Take 1 tablet (5 mg total) by mouth every 6 (six) hours as needed for severe pain. (Patient not taking: No sig reported) 10 tablet 0   venlafaxine XR (EFFEXOR-XR) 75 MG 24 hr capsule Take 1 capsule (75 mg total) by mouth daily with breakfast. (Patient not taking: Reported on 10/12/2021) 30 capsule 1   No current facility-administered medications for this encounter.    Physical Findings: The patient is in no acute distress. Patient is alert and oriented.  height is '5\' 2"'  (1.575 m) and weight is 155 lb 9.6 oz (70.6 kg). Her temporal temperature is 97.7 F (36.5 C). Her blood pressure is 143/83 (abnormal) and her pulse is 92. Her respiration is 18 and oxygen saturation is 100%. .   Lungs are clear to auscultation bilaterally. Heart has regular rate and rhythm. No palpable cervical, supraclavicular, or axillary adenopathy. Abdomen soft, non-tender, normal bowel sounds.   Right Breast: no palpable mass, nipple discharge or bleeding.  The left breast area shows mild hyperpigmentation changes.  she continues to have significant edema throughout the breast.  No signs  of infection within the breast.  No nipple discharge or bleeding.  Induration noted inferior to her axillary scar somewhat tender with palpation.    Lab Findings: Lab Results  Component Value Date   WBC 3.7 (L) 10/02/2021   HGB 12.5 10/02/2021   HCT 37.5 10/02/2021   MCV 91.5 10/02/2021   PLT 218 10/02/2021    Radiographic Findings: No results found.  Impression:  Stage IA (pT1c, pN0) Left Breast UOQ, Invasive Ductal Carcinoma with intermediate grade DCIS, ER+ / PR+ / Her2-, Grade 2  The patient  is recovering from the effects of radiation.  She continues to have significant edema in the breast area.  Unsure if she completed recommended physical therapy.  She would like to see Jerrye Beavers for physical therapy and Dr. Burr Medico has requested this earlier this month.  Plan: Routine follow-up in 3 months.  After physical therapy if her breast edema has decreased then we will likely defer follow-up to medical oncology at that point.   15 minutes of total time was spent for this patient encounter, including preparation, face-to-face counseling with the patient and coordination of care, physical exam, and documentation of the encounter. ____________________________________  Blair Promise, PhD, MD   This document serves as a record of services personally performed by Gery Pray, MD. It was created on his behalf by Roney Mans, a trained medical scribe. The creation of this record is based on the scribe's personal observations and the provider's statements to them. This document has been checked and approved by the attending provider.

## 2021-10-12 ENCOUNTER — Ambulatory Visit
Admission: RE | Admit: 2021-10-12 | Discharge: 2021-10-12 | Disposition: A | Discharge status: Home / Self Care | Payer: Managed Care, Other (non HMO) | Source: Ambulatory Visit | Attending: Radiation Oncology | Admitting: Radiation Oncology

## 2021-10-12 ENCOUNTER — Telehealth: Payer: Self-pay | Admitting: *Deleted

## 2021-10-12 ENCOUNTER — Encounter: Payer: Self-pay | Admitting: Radiation Oncology

## 2021-10-12 ENCOUNTER — Other Ambulatory Visit: Payer: Self-pay

## 2021-10-12 VITALS — BP 143/83 | HR 92 | Temp 97.7°F | Resp 18 | Ht 62.0 in | Wt 155.6 lb

## 2021-10-12 DIAGNOSIS — Z923 Personal history of irradiation: Secondary | ICD-10-CM | POA: Insufficient documentation

## 2021-10-12 DIAGNOSIS — C50412 Malignant neoplasm of upper-outer quadrant of left female breast: Secondary | ICD-10-CM | POA: Diagnosis present

## 2021-10-12 DIAGNOSIS — N644 Mastodynia: Secondary | ICD-10-CM | POA: Insufficient documentation

## 2021-10-12 DIAGNOSIS — Z170 Estrogen receptor positive status [ER+]: Secondary | ICD-10-CM | POA: Insufficient documentation

## 2021-10-12 DIAGNOSIS — R232 Flushing: Secondary | ICD-10-CM | POA: Diagnosis not present

## 2021-10-12 DIAGNOSIS — Z79810 Long term (current) use of selective estrogen receptor modulators (SERMs): Secondary | ICD-10-CM | POA: Insufficient documentation

## 2021-10-12 DIAGNOSIS — R609 Edema, unspecified: Secondary | ICD-10-CM | POA: Diagnosis not present

## 2021-10-12 HISTORY — DX: Malignant neoplasm of unspecified site of unspecified female breast: C50.919

## 2021-10-12 NOTE — Progress Notes (Signed)
Cindy [name] is here today for follow up post radiation to the breast.   Breast Side:left   They completed their radiation on: 04/06/2021   Does the patient complain of any of the following: Post radiation skin issues: denies Breast Tenderness: occasionally Breast Swelling: yes Lymphadema: yes Range of Motion limitations: demonstrates full range of motion. Reports tightness when stretching Fatigue post radiation: mild fatigue Appetite good/fair/poor: good  Additional comments if applicable: hot flashes, dry mouth, metallic taste, occasional pain and "twinges" in left breast.  Vitals:   10/12/21 1055  BP: (!) 143/83  Pulse: 92  Resp: 18  Temp: 97.7 F (36.5 C)  TempSrc: Temporal  SpO2: 100%  Weight: 155 lb 9.6 oz (70.6 kg)  Height: 5\' 2"  (1.575 m)

## 2021-10-12 NOTE — Telephone Encounter (Signed)
CALLED PATIENT TO ASK ABOUT SCHEDULING 3 MONTH FU, PATIENT AGREED TO COME ON 01-14-22 @ 11:45 AM

## 2021-10-15 ENCOUNTER — Ambulatory Visit: Payer: Managed Care, Other (non HMO) | Attending: General Surgery | Admitting: Physical Therapy

## 2021-10-15 ENCOUNTER — Other Ambulatory Visit: Payer: Self-pay

## 2021-10-15 ENCOUNTER — Encounter: Payer: Self-pay | Admitting: Physical Therapy

## 2021-10-15 DIAGNOSIS — M25612 Stiffness of left shoulder, not elsewhere classified: Secondary | ICD-10-CM | POA: Insufficient documentation

## 2021-10-15 DIAGNOSIS — Z483 Aftercare following surgery for neoplasm: Secondary | ICD-10-CM | POA: Diagnosis not present

## 2021-10-15 DIAGNOSIS — C50412 Malignant neoplasm of upper-outer quadrant of left female breast: Secondary | ICD-10-CM | POA: Insufficient documentation

## 2021-10-15 DIAGNOSIS — I890 Lymphedema, not elsewhere classified: Secondary | ICD-10-CM | POA: Insufficient documentation

## 2021-10-15 DIAGNOSIS — Z170 Estrogen receptor positive status [ER+]: Secondary | ICD-10-CM | POA: Insufficient documentation

## 2021-10-15 DIAGNOSIS — R293 Abnormal posture: Secondary | ICD-10-CM | POA: Diagnosis present
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
## 2021-10-20 ENCOUNTER — Other Ambulatory Visit: Payer: Self-pay

## 2021-10-20 ENCOUNTER — Ambulatory Visit: Payer: Managed Care, Other (non HMO)

## 2021-10-20 DIAGNOSIS — M25612 Stiffness of left shoulder, not elsewhere classified: Secondary | ICD-10-CM

## 2021-10-20 DIAGNOSIS — R293 Abnormal posture: Secondary | ICD-10-CM

## 2021-10-20 DIAGNOSIS — Z483 Aftercare following surgery for neoplasm: Secondary | ICD-10-CM | POA: Diagnosis not present

## 2021-10-20 DIAGNOSIS — I890 Lymphedema, not elsewhere classified: Secondary | ICD-10-CM

## 2021-10-20 DIAGNOSIS — C50412 Malignant neoplasm of upper-outer quadrant of left female breast: Secondary | ICD-10-CM

## 2021-10-20 DIAGNOSIS — Z170 Estrogen receptor positive status [ER+]: Secondary | ICD-10-CM

## 2021-10-20 NOTE — Patient Instructions (Signed)
Self manual lymph drainage: Perform this sequence once a day.  Only give enough pressure to your skin to make the skin move.  Hug yourself.  Do circles at your neck just above your collarbones.  Repeat this 10 times.  Diaphragmatic - Supine   Inhale through nose making navel move out toward hands. Exhale through puckered lips, hands follow navel in. Repeat _5__ times. Rest _10__ seconds between repeat.  Axilla - One at a Time   Using full weight of flat hand and fingers at center of uninvolved armpit, make _10__ in-place circles.   Copyright  VHI. All rights reserved.  LEG: Inguinal Nodes Stimulation   With small finger side of hand against hip crease on involved side, gently perform circles at the crease. Repeat __10_ times.   Copyright  VHI. All rights reserved.  Axilla to Inguinal Nodes - Sweep   On involved side, pump _4__ times from armpit along side of trunk to hip crease.  Now gently stretch skin from the involved side to the uninvolved side across the chest at the shoulder line.  Repeat that 4 times.  Draw an imaginary diagonal line from upper outer breast through the nipple area toward lower inner breast.  Direct fluid upward and inward from this line toward the pathway across your upper chest .  Do this in three rows to treat all of the upper inner breast tissue, and do each row 3-4x.      Direct fluid to treat all of lower outer breast tissue downward and outward toward pathway that is aimed at the left groin.  Finish by doing the pathways as described above going from your involved armpit to the same side groin and going across your upper chest from the involved shoulder to the uninvolved shoulder.  Repeat the steps above where you do circles in your left groin and right armpit. Copyright  VHI. All rights reserved.
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
## 2021-10-22 ENCOUNTER — Ambulatory Visit: Payer: Managed Care, Other (non HMO)

## 2021-10-22 ENCOUNTER — Other Ambulatory Visit: Payer: Self-pay

## 2021-10-22 DIAGNOSIS — Z170 Estrogen receptor positive status [ER+]: Secondary | ICD-10-CM

## 2021-10-22 DIAGNOSIS — R293 Abnormal posture: Secondary | ICD-10-CM

## 2021-10-22 DIAGNOSIS — Z483 Aftercare following surgery for neoplasm: Secondary | ICD-10-CM

## 2021-10-22 DIAGNOSIS — M25612 Stiffness of left shoulder, not elsewhere classified: Secondary | ICD-10-CM

## 2021-10-22 DIAGNOSIS — C50412 Malignant neoplasm of upper-outer quadrant of left female breast: Secondary | ICD-10-CM

## 2021-10-22 DIAGNOSIS — I890 Lymphedema, not elsewhere classified: Secondary | ICD-10-CM
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
## 2021-10-26 ENCOUNTER — Ambulatory Visit: Payer: Managed Care, Other (non HMO)

## 2021-10-26 ENCOUNTER — Other Ambulatory Visit: Payer: Self-pay

## 2021-10-26 DIAGNOSIS — R293 Abnormal posture: Secondary | ICD-10-CM

## 2021-10-26 DIAGNOSIS — Z483 Aftercare following surgery for neoplasm: Secondary | ICD-10-CM | POA: Diagnosis not present

## 2021-10-26 DIAGNOSIS — C50412 Malignant neoplasm of upper-outer quadrant of left female breast: Secondary | ICD-10-CM

## 2021-10-26 DIAGNOSIS — I890 Lymphedema, not elsewhere classified: Secondary | ICD-10-CM

## 2021-10-26 DIAGNOSIS — M25612 Stiffness of left shoulder, not elsewhere classified: Secondary | ICD-10-CM
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
## 2021-10-29 ENCOUNTER — Other Ambulatory Visit: Payer: Self-pay

## 2021-10-29 ENCOUNTER — Ambulatory Visit: Payer: Managed Care, Other (non HMO) | Attending: General Surgery

## 2021-10-29 DIAGNOSIS — Z170 Estrogen receptor positive status [ER+]: Secondary | ICD-10-CM | POA: Diagnosis present

## 2021-10-29 DIAGNOSIS — Z483 Aftercare following surgery for neoplasm: Secondary | ICD-10-CM | POA: Insufficient documentation

## 2021-10-29 DIAGNOSIS — I890 Lymphedema, not elsewhere classified: Secondary | ICD-10-CM | POA: Diagnosis present

## 2021-10-29 DIAGNOSIS — R293 Abnormal posture: Secondary | ICD-10-CM | POA: Insufficient documentation

## 2021-10-29 DIAGNOSIS — M25612 Stiffness of left shoulder, not elsewhere classified: Secondary | ICD-10-CM | POA: Insufficient documentation

## 2021-10-29 DIAGNOSIS — C50412 Malignant neoplasm of upper-outer quadrant of left female breast: Secondary | ICD-10-CM | POA: Insufficient documentation

## 2021-10-29 NOTE — Patient Instructions (Signed)
Strengthening: Resisted Flexion    Cancer Rehab [phone]    Hold tubing with left arm at side. Pull forward and up. Move shoulder through pain-free range of motion. Repeat _5-10___ times per set. Do _1-2___ sessions per day.  Strengthening: Resisted Internal Rotation    Hold tubing in left hand, elbow at side and forearm out. Rotate forearm in across body. Repeat _5-10___ times per set. Do _1-2___ sessions per day.  Strengthening: Resisted Extension    Hold tubing in left hand, arm forward. Pull arm back, elbow straight. Repeat __5-10__ times per set. Do __1-2__ sessions per day.   Strengthening: Resisted External Rotation    Hold tubing in left hand, elbow at side and forearm across body. Rotate forearm out. Repeat _5-10___ times per set. Do __1-2__ sessions per day.    
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
## 2021-11-03 ENCOUNTER — Ambulatory Visit: Payer: Managed Care, Other (non HMO)

## 2021-11-03 ENCOUNTER — Other Ambulatory Visit: Payer: Self-pay

## 2021-11-03 DIAGNOSIS — Z483 Aftercare following surgery for neoplasm: Secondary | ICD-10-CM

## 2021-11-03 DIAGNOSIS — R293 Abnormal posture: Secondary | ICD-10-CM

## 2021-11-03 DIAGNOSIS — M25612 Stiffness of left shoulder, not elsewhere classified: Secondary | ICD-10-CM

## 2021-11-03 DIAGNOSIS — Z170 Estrogen receptor positive status [ER+]: Secondary | ICD-10-CM

## 2021-11-03 DIAGNOSIS — I890 Lymphedema, not elsewhere classified: Secondary | ICD-10-CM

## 2021-11-03 DIAGNOSIS — C50412 Malignant neoplasm of upper-outer quadrant of left female breast: Secondary | ICD-10-CM
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
## 2021-11-05 ENCOUNTER — Other Ambulatory Visit: Payer: Self-pay

## 2021-11-05 ENCOUNTER — Ambulatory Visit: Payer: Managed Care, Other (non HMO)

## 2021-11-05 DIAGNOSIS — Z483 Aftercare following surgery for neoplasm: Secondary | ICD-10-CM

## 2021-11-05 DIAGNOSIS — M25612 Stiffness of left shoulder, not elsewhere classified: Secondary | ICD-10-CM

## 2021-11-05 DIAGNOSIS — R293 Abnormal posture: Secondary | ICD-10-CM

## 2021-11-05 DIAGNOSIS — I890 Lymphedema, not elsewhere classified: Secondary | ICD-10-CM

## 2021-11-05 DIAGNOSIS — C50412 Malignant neoplasm of upper-outer quadrant of left female breast: Secondary | ICD-10-CM
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
## 2021-11-09 ENCOUNTER — Ambulatory Visit: Payer: Managed Care, Other (non HMO)

## 2021-11-10 ENCOUNTER — Other Ambulatory Visit: Payer: Self-pay

## 2021-11-10 ENCOUNTER — Ambulatory Visit: Payer: Managed Care, Other (non HMO)

## 2021-11-10 DIAGNOSIS — Z483 Aftercare following surgery for neoplasm: Secondary | ICD-10-CM | POA: Diagnosis not present

## 2021-11-10 DIAGNOSIS — I890 Lymphedema, not elsewhere classified: Secondary | ICD-10-CM

## 2021-11-10 DIAGNOSIS — Z170 Estrogen receptor positive status [ER+]: Secondary | ICD-10-CM

## 2021-11-10 DIAGNOSIS — C50412 Malignant neoplasm of upper-outer quadrant of left female breast: Secondary | ICD-10-CM

## 2021-11-10 DIAGNOSIS — M25612 Stiffness of left shoulder, not elsewhere classified: Secondary | ICD-10-CM

## 2021-11-10 DIAGNOSIS — R293 Abnormal posture: Secondary | ICD-10-CM
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
## 2021-11-12 ENCOUNTER — Ambulatory Visit: Payer: Managed Care, Other (non HMO)

## 2021-11-12 ENCOUNTER — Other Ambulatory Visit: Payer: Self-pay

## 2021-11-12 VITALS — Wt 153.2 lb

## 2021-11-12 DIAGNOSIS — I890 Lymphedema, not elsewhere classified: Secondary | ICD-10-CM

## 2021-11-12 DIAGNOSIS — Z483 Aftercare following surgery for neoplasm: Secondary | ICD-10-CM

## 2021-11-12 DIAGNOSIS — M25612 Stiffness of left shoulder, not elsewhere classified: Secondary | ICD-10-CM

## 2021-11-12 DIAGNOSIS — R293 Abnormal posture: Secondary | ICD-10-CM

## 2021-11-12 DIAGNOSIS — Z170 Estrogen receptor positive status [ER+]: Secondary | ICD-10-CM

## 2021-11-12 NOTE — Patient Instructions (Signed)
Over Head Pull: Narrow and Wide Grip   Cancer Rehab [phone]   On back, knees bent, feet flat, band across thighs, elbows straight but relaxed. Pull hands apart (start). Keeping elbows straight, bring arms up and over head, hands toward floor. Keep pull steady on band. Hold momentarily. Return slowly, keeping pull steady, back to start. Then do same with a wider grip on the band (past shoulder width) Repeat _5-10__ times. Band color __yellow_or red___   Side Pull: Double Arm   On back, knees bent, feet flat. Arms perpendicular to body, shoulder level, elbows straight but relaxed. Pull arms out to sides, elbows straight. Resistance band comes across collarbones, hands toward floor. Hold momentarily. Slowly return to starting position. Repeat _5-10__ times. Band color _yellow_or red___   Sword   On back, knees bent, feet flat, left hand on left hip, right hand above left. Pull right arm DIAGONALLY (hip to shoulder) across chest. Bring right arm along head toward floor. Hold momentarily. Slowly return to starting position. Repeat _5-10__ times. Do with left arm. Band color _yellow_or red____   Shoulder Rotation: Double Arm   On back, knees bent, feet flat, elbows tucked at sides, bent 90, hands palms up. Pull hands apart and down toward floor, keeping elbows near sides. Hold momentarily. Slowly return to starting position. Repeat _5-10__ times. Band color __yellow or red____
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
## 2021-11-27 ENCOUNTER — Ambulatory Visit (AMBULATORY_SURGERY_CENTER): Payer: Managed Care, Other (non HMO) | Admitting: *Deleted

## 2021-11-27 ENCOUNTER — Other Ambulatory Visit: Payer: Self-pay

## 2021-11-27 ENCOUNTER — Encounter: Payer: Self-pay | Admitting: Internal Medicine

## 2021-11-27 VITALS — Ht 62.0 in | Wt 150.0 lb

## 2021-11-27 DIAGNOSIS — Z1211 Encounter for screening for malignant neoplasm of colon: Secondary | ICD-10-CM

## 2021-11-27 MED ORDER — NA SULFATE-K SULFATE-MG SULF 17.5-3.13-1.6 GM/177ML PO SOLN: 1.0000 | Freq: Once | ORAL | 0 refills | Status: AC

## 2021-11-27 NOTE — Progress Notes (Signed)
PV completed over the phone. Pt verified name, DOB, address and insurance during PV today.  Pt  not mailed instruction packet as procedure Tuesday 12-6 , no time to mail - sent via [place] and emailed to [email or web address]   Pt encouraged to call with questions or issues.  If pt has My chart, procedure instructions sent via My Chart   No egg or soy allergy known to patient  issues known to pt with past sedation with any surgeries or procedures of PONV  Patient denies ever being told they had issues or difficulty with intubation  No FH of Malignant Hyperthermia Pt is not on diet pills Pt is not on  home 02  Pt is not on blood thinners  Pt denies issues with constipation  No A fib or A flutter  Pt is fully vaccinated  for Covid   NO PA's for preps discussed with pt In PV today  Discussed with pt there will be an out-of-pocket cost for prep and that varies from $0 to 70 +  dollars - pt verbalized understanding   Due to the COVID-19 pandemic we are asking patients to follow certain guidelines in PV and the [place]   Pt aware of COVID protocols and [facility] guidelines

## 2021-12-01 ENCOUNTER — Encounter: Payer: Self-pay | Admitting: Internal Medicine

## 2021-12-01 ENCOUNTER — Ambulatory Visit (AMBULATORY_SURGERY_CENTER): Payer: Managed Care, Other (non HMO) | Admitting: Internal Medicine

## 2021-12-01 ENCOUNTER — Other Ambulatory Visit: Payer: Self-pay

## 2021-12-01 VITALS — BP 134/79 | HR 77 | Temp 98.9°F | Resp 15 | Ht 62.0 in | Wt 150.0 lb

## 2021-12-01 DIAGNOSIS — K635 Polyp of colon: Secondary | ICD-10-CM

## 2021-12-01 DIAGNOSIS — Z1211 Encounter for screening for malignant neoplasm of colon: Secondary | ICD-10-CM | POA: Diagnosis present

## 2021-12-01 DIAGNOSIS — D122 Benign neoplasm of ascending colon: Secondary | ICD-10-CM

## 2021-12-01 MED ORDER — SODIUM CHLORIDE 0.9 % IV SOLN: 500.0000 mL | Freq: Once | INTRAVENOUS | Status: DC

## 2021-12-01 NOTE — Progress Notes (Signed)
Called to room to assist during endoscopic procedure.  Patient ID and intended procedure confirmed with present staff. Received instructions for my participation in the procedure from the performing physician.  

## 2021-12-01 NOTE — Progress Notes (Signed)
Pt's states no medical or surgical changes since previsit or office visit.   VS taken by [place]

## 2021-12-01 NOTE — Progress Notes (Signed)
GASTROENTEROLOGY PROCEDURE H&P NOTE   Primary Care Physician: Lorrene Reid, PA-C    Reason for Procedure:   Colon cancer screening  Plan:    Colonoscopy  Patient is appropriate for endoscopic procedure(s) in the ambulatory ([facility]) setting.  The nature of the procedure, as well as the risks, benefits, and alternatives were carefully and thoroughly reviewed with the patient. Ample time for discussion and questions allowed. The patient understood, was satisfied, and agreed to proceed.     HPI: Cindy [name] is a 50 y.o. female who presents for colonoscopy for colon cancer screening. Denies blood in stools, changes in bowel habits, or weight loss. Denies fam hx of colon cancer. Denies blood thinners.  Past Medical History:  Diagnosis Date   Anxiety    Breast cancer ([place]) 12/2020   left- + radiation, no chemotherapy   Elevated blood pressure reading    only in [place] office, no meds , no HTN   GERD (gastroesophageal reflux disease)    OCC ? from Tamoxifen   History of radiation therapy 03/09/2021-04/06/2021   left breast    Dr Gery Pray   Medical history non-contributory    PONV (postoperative nausea and vomiting)     Past Surgical History:  Procedure Laterality Date   BREAST LUMPECTOMY WITH RADIOACTIVE SEED AND SENTINEL LYMPH NODE BIOPSY Left 01/28/2021   Procedure: LEFT BREAST LUMPECTOMY WITH RADIOACTIVE SEED AND LEFT AXILLARY SENTINEL LYMPH NODE BIOPSY;  Surgeon: Rolm Bookbinder, MD;  Location: [facility];  Service: General;  Laterality: Left;  PEC BLOCK   BREAST SURGERY Left    breast biopsy   EXCISION OF BREAST BIOPSY      Prior to Admission medications   Medication Sig Start Date End Date Taking? Authorizing Provider  acetaminophen (TYLENOL) 500 MG tablet Take 500 mg by mouth as needed.   Yes [provider]  Ibuprofen 200 MG CAPS Take 400 mg by mouth as needed.   Yes [provider]  tamoxifen (NOLVADEX) 20 MG tablet Take 1  tablet (20 mg total) by mouth daily. 07/02/21  Yes Alla Feeling, NP    Current Outpatient Medications  Medication Sig Dispense Refill   acetaminophen (TYLENOL) 500 MG tablet Take 500 mg by mouth as needed.     Ibuprofen 200 MG CAPS Take 400 mg by mouth as needed.     tamoxifen (NOLVADEX) 20 MG tablet Take 1 tablet (20 mg total) by mouth daily. 90 tablet 3   Current Facility-Administered Medications  Medication Dose Route Frequency Provider Last Rate Last Admin   0.9 %  sodium chloride infusion  500 mL Intravenous Once Sharyn Creamer, MD        Allergies as of 12/01/2021 - Review Complete 12/01/2021  Allergen Reaction Noted   Latex Swelling and Rash 11/29/2013    Family History  Problem Relation Age of Onset   Cancer Father        brain   Diabetes Brother    Cancer Maternal Grandmother        lung   Cancer Maternal Grandfather        brain and bone   Cancer Paternal Grandfather        brain   Colon cancer Neg Hx    Colon polyps Neg Hx    Esophageal cancer Neg Hx    Stomach cancer Neg Hx    Rectal cancer Neg Hx     Social History   Socioeconomic History   Marital status: Married  Spouse name: Not on file   Number of children: 1   Years of education: Not on file   Highest education level: Not on file  Occupational History   Occupation: home maker   Tobacco Use   Smoking status: Never   Smokeless tobacco: Never  Vaping Use   Vaping Use: Never used  Substance and Sexual Activity   Alcohol use: Not Currently    Alcohol/week: 6.0 standard drinks    Types: 6 Cans of beer per week    Comment: few times a week, stopped in 2020   Drug use: No   Sexual activity: Yes    Birth control/protection: None  Other Topics Concern   Not on file  Social History Narrative   Not on file   Social Determinants of Health   Financial Resource Strain: Not on file  Food Insecurity: Not on file  Transportation Needs: Not on file  Physical Activity: Not on file  Stress: Not  on file  Social Connections: Not on file  Intimate Partner Violence: Not on file    Physical Exam: Vital signs in last 24 hours: BP 138/78   Pulse 94   Temp 98.9 F (37.2 C)   Ht 5\' 2"  (1.575 m)   Wt 150 lb (68 kg)   LMP  (LMP Unknown) Comment: last period 1 yr 10-2021  SpO2 100%   BMI 27.44 kg/m  GEN: NAD EYE: Sclerae anicteric ENT: MMM CV: Non-tachycardic Pulm: No increased work of breathing GI: Soft, NT/ND NEURO:  Alert & Oriented   [name], MD [facility]  12/01/2021 11:17 AM

## 2021-12-01 NOTE — Patient Instructions (Signed)

## 2021-12-01 NOTE — Op Note (Signed)
[facility] Patient Name: Cindy [name] Procedure Date: 12/01/2021 11:13 AM MRN: [id number] Endoscopist: Sonny Masters "Cindy [name] ,  Age: 50 Referring MD:  Date of Birth: 12-Oct-1971 Gender: Female Account #: [id number] Procedure:                Colonoscopy Indications:              Screening for colorectal malignant neoplasm, This                            is the patient's first colonoscopy Medicines:                Monitored Anesthesia Care Procedure:                Pre-Anesthesia Assessment:                           - Prior to the procedure, a History and Physical                            was performed, and patient medications and                            allergies were reviewed. The patient's tolerance of                            previous anesthesia was also reviewed. The risks                            and benefits of the procedure and the sedation                            options and risks were discussed with the patient.                            All questions were answered, and informed consent                            was obtained. Prior Anticoagulants: The patient has                            taken no previous anticoagulant or antiplatelet                            agents. ASA Grade Assessment: II - A patient with                            mild systemic disease. After reviewing the risks                            and benefits, the patient was deemed in                            satisfactory condition to undergo the procedure.  After obtaining informed consent, the colonoscope                            was passed under direct vision. Throughout the                            procedure, the patient's blood pressure, pulse, and                            oxygen saturations were monitored continuously. The                            CF [id number] #[id number] was introduced through the anus                            and advanced to the  the terminal ileum. The                            colonoscopy was performed without difficulty. The                            patient tolerated the procedure well. The quality                            of the bowel preparation was good. The terminal                            ileum, ileocecal valve, appendiceal orifice, and                            rectum were photographed. Scope In: 11:23:21 AM Scope Out: 11:37:14 AM Scope Withdrawal Time: 0 hours 9 minutes 26 seconds  Total Procedure Duration: 0 hours 13 minutes 53 seconds  Findings:                 The terminal ileum appeared normal.                           A 6 mm polyp was found in the ascending colon. The                            polyp was sessile. The polyp was removed with a                            cold snare. Resection and retrieval were complete.                           A few diverticula were found in the sigmoid colon.                           Non-bleeding internal hemorrhoids were found during                            retroflexion. Complications:  No immediate complications. Estimated Blood Loss:     Estimated blood loss was minimal. Impression:               - The examined portion of the ileum was normal.                           - One 6 mm polyp in the ascending colon, removed                            with a cold snare. Resected and retrieved.                           - Diverticulosis in the sigmoid colon.                           - Non-bleeding internal hemorrhoids. Recommendation:           - Discharge patient to home (with escort).                           - Await pathology results.                           - The findings and recommendations were discussed                            with the patient. Sonny Masters "Cindy [name],  12/01/2021 11:41:24 AM

## 2021-12-01 NOTE — Progress Notes (Signed)
Sedate, gd SR, tolerated procedure well, VSS, report to RN 

## 2021-12-03 ENCOUNTER — Telehealth: Payer: Self-pay | Admitting: *Deleted

## 2021-12-03 NOTE — Telephone Encounter (Signed)
  Follow up Call-  Call back number 12/01/2021  Post procedure Call Back phone  # [phone]  Permission to leave phone message Yes  Some recent data might be hidden     Patient questions:  Do you have a fever, pain , or abdominal swelling? No. Pain Score  0 *  Have you tolerated food without any problems? Yes.    Have you been able to return to your normal activities? Yes.    Do you have any questions about your discharge instructions: Diet   No. Medications  No. Follow up visit  No.  Do you have questions or concerns about your Care? No.  Actions: * If pain score is 4 or above: No action needed, pain <4.

## 2021-12-04 ENCOUNTER — Encounter: Payer: Self-pay | Admitting: Internal Medicine

## 2021-12-16 LAB — HM PAP SMEAR: HM Pap smear: NEGATIVE

## 2021-12-17 ENCOUNTER — Ambulatory Visit
Admission: RE | Admit: 2021-12-17 | Discharge: 2021-12-17 | Disposition: A | Discharge status: Home / Self Care | Payer: Managed Care, Other (non HMO) | Source: Ambulatory Visit | Attending: Nurse Practitioner | Admitting: Nurse Practitioner

## 2021-12-17 DIAGNOSIS — C50412 Malignant neoplasm of upper-outer quadrant of left female breast: Secondary | ICD-10-CM

## 2021-12-29 ENCOUNTER — Encounter: Payer: Self-pay | Admitting: Nurse Practitioner

## 2021-12-29 ENCOUNTER — Ambulatory Visit (INDEPENDENT_AMBULATORY_CARE_PROVIDER_SITE_OTHER): Payer: Managed Care, Other (non HMO) | Admitting: Nurse Practitioner

## 2021-12-29 VITALS — Ht 62.0 in | Wt 150.0 lb

## 2021-12-29 DIAGNOSIS — U071 COVID-19: Secondary | ICD-10-CM

## 2021-12-29 DIAGNOSIS — J069 Acute upper respiratory infection, unspecified: Secondary | ICD-10-CM

## 2021-12-29 MED ORDER — MOLNUPIRAVIR EUA 200MG CAPSULE: 4.0000 | ORAL_CAPSULE | Freq: Two times a day (BID) | ORAL | 0 refills | Status: AC

## 2021-12-29 NOTE — Progress Notes (Signed)
Virtual Visit via Telephone Note  I connected with Cindy [name] on 12/29/21 at  3:50 PM EST by telephone and verified that I am speaking with the correct person using two identifiers.  Location: Patient: home Provider: [place] [facility]     I discussed the limitations, risks, security and privacy concerns of performing an evaluation and management service by telephone and the availability of in person appointments. I also discussed with the patient that there may be a patient responsible charge related to this service. The patient expressed understanding and agreed to proceed.   History of Present Illness: Patient presents for an acute visit.  She states that yesterday she woke up with severe congestion, fever, chills, headache, and mild cough.  She states that today she had had more nasal rhinitis along with coughing, sneezing, with less severe headache.  She stated scratchy throat.  She states that she has decreased appetite.  She denies nausea or vomiting.  She did take home test for COVID-19 today.  Test results were positive.  She states her husband and son have both had positive test for COVID-19 as well.   Observations/Objective:  The patient is alert and oriented. She is pleasant and answers all questions appropriately. Breathing is non-labored. She is in no acute distress at this time.  The patient has nasal congestion and a mild, loose sounding cough.  Today's Vitals   12/29/21 1556  Weight: 150 lb (68 kg)  Height: 5\' 2"  (1.575 m)   Body mass index is 27.44 kg/m.   Assessment and Plan: 1. Upper respiratory tract infection due to COVID-19 virus Start molnupiravir 800 mg twice daily for next 5 days. Rest and increase fluids. Continue using OTC medication to control symptoms.  hould also take OTC zinc, vitamin d, and vitamin c every day.  Recommend that she seek immediate emergency care if she develops shortness of breath and decreased oxygen saturations.  She  voiced understanding and agreement with the plan. - molnupiravir EUA (LAGEVRIO) 200 mg CAPS capsule; Take 4 capsules (800 mg total) by mouth 2 (two) times daily for 5 days.  Dispense: 40 capsule; Refill: 0   Follow Up Instructions:    I discussed the assessment and treatment plan with the patient. The patient was provided an opportunity to ask questions and all were answered. The patient agreed with the plan and demonstrated an understanding of the instructions.   The patient was advised to call back or seek an in-person evaluation if the symptoms worsen or if the condition fails to improve as anticipated.  I provided 15 minutes of non-face-to-face time during this encounter.   Ronnell Freshwater, NP   This note was dictated using Systems analyst. Rapid proofreading was performed to expedite the delivery of the information. Despite proofreading, phonetic errors will occur which are common with this voice recognition software. Please take this into consideration. If there are any concerns, please contact our office.

## 2022-01-11 ENCOUNTER — Telehealth: Payer: Self-pay | Admitting: *Deleted

## 2022-01-11 NOTE — Telephone Encounter (Signed)
CALLED PATIENT TO ASK ABOUT RESCHEDULING FU APPT. ON 01-14-22 DUE TO HER HAVING COVID, RESCHEDULED FOR 01-28-22 @ 8:15 AM, PATIENT AGREED TO NEW DATE AND TIME

## 2022-01-13 ENCOUNTER — Encounter: Payer: Self-pay | Admitting: Physician Assistant

## 2022-01-14 ENCOUNTER — Ambulatory Visit: Payer: Self-pay | Admitting: Radiation Oncology

## 2022-01-27 NOTE — Progress Notes (Signed)
Radiation Oncology         [phone]) [phone] ________________________________  Name: Cindy [name] MRN: [id number]  Date: 01/28/2022  DOB: 1971-12-09  Follow-Up Visit Note  CC: Lorrene Reid, Serena Colonel, MD    ICD-10-CM   1. Malignant neoplasm of upper-outer quadrant of left breast in female, estrogen receptor positive ([place])  C50.412    Z17.0       Diagnosis:  Stage IA (pT1c, pN0) Left Breast UOQ, Invasive Ductal Carcinoma with intermediate grade DCIS, ER+ / PR+ / Her2-, Grade 2  Interval Since Last Radiation: 9 months and 21 days ago  Intent: Curative  Radiation Treatment Dates: 03/09/2021 through 04/06/2021 Site Technique Total Dose (Gy) Dose per Fx (Gy) Completed Fx Beam Energies  Breast, Left: Breast_Lt 3D 40.05/40.05 2.67 15/15 6X, 10X  Breast, Left: Breast_Lt_Bst 3D 10/10 2 5/5 6X, 10X    Narrative:  The patient returns today for routine 3 month follow-up, she was last seen here for follow up on 10/12/21.   Pertinent imaging since the patient was last seen includes a diagnostic bilateral mammogram on 12/17/21 which demonstrated no mammographic evidence for malignancy, and interval lumpectomy changes in the left breast.     In the interval, the patient also completed her physical therapy on 11/12/21. Per PT notes, the patient showed great improvement in mobility following therapy.   The patient also underwent a colon cancer screening on 12/01/21. Biopsies of the colon on 12/01/21 showed sessile serrated polyp without cytologic dysplasia.   She reports improvement in her breast lymphedema.  She very rarely will take any pain medication for this issue.  She does have the flexi touch lymphedema equipment which he uses on a regular basis.  She denies any nipple discharge or bleeding.  She denies any swelling in her left arm or hand.  She reports not having a menstrual cycle in over a year and will likely undergo testing to see if she is officially postmenopausal.  She may  be switched from tamoxifen to another agent if that is the case.            Allergies:  is allergic to latex.  Meds: Current Outpatient Medications  Medication Sig Dispense Refill   acetaminophen (TYLENOL) 500 MG tablet Take 500 mg by mouth as needed.     Ibuprofen 200 MG CAPS Take 400 mg by mouth as needed.     tamoxifen (NOLVADEX) 20 MG tablet Take 1 tablet (20 mg total) by mouth daily. 90 tablet 3   No current facility-administered medications for this encounter.    Physical Findings: The patient is in no acute distress. Patient is alert and oriented.  height is 5' 2" (1.575 m) and weight is 153 lb 4 oz (69.5 kg). Her temporal temperature is 97.6 F (36.4 C). Her blood pressure is 150/93 (abnormal) and her pulse is 81. Her respiration is 18 and oxygen saturation is 100%. .  Lungs are clear to auscultation bilaterally. Heart has regular rate and rhythm. No palpable cervical, supraclavicular, or axillary adenopathy. Abdomen soft, non-tender, normal bowel sounds.  Right breast: no palpable mass, nipple discharge or bleeding.  The left breast area shows improvement in her lymphedema.  Mild hyperpigmentation changes noted in the breast and mild to moderate lymphedema.  No dominant mass appreciated in the breast nipple discharge or bleeding.    Lab Findings: Lab Results  Component Value Date   WBC 3.7 (L) 10/02/2021   HGB 12.5 10/02/2021   HCT 37.5  10/02/2021   MCV 91.5 10/02/2021   PLT 218 10/02/2021    Radiographic Findings: No results found.  Impression:  Stage IA (pT1c, pN0) Left Breast UOQ, Invasive Ductal Carcinoma with intermediate grade DCIS, ER+ / PR+ / Her2-, Grade 2  The patient is recovering from the effects of radiation.  She has had significant improvement in her breast lymphedema with aggressive physical therapy and equipment as above.  She is pleased with results.  No evidence of recurrence on clinical exam today.  Recent mammography also encouraging.  Plan: As  needed follow-up in radiation oncology.  The patient will continue close follow-up with medical oncology and continue on adjuvant hormonal therapy.  She will have an  additional sozo measurement later this month.   15 minutes of total time was spent for this patient encounter, including preparation, face-to-face counseling with the patient and coordination of care, physical exam, and documentation of the encounter. ____________________________________  Blair Promise, PhD, MD  This document serves as a record of services personally performed by Gery Pray, MD. It was created on his behalf by Roney Mans, a trained medical scribe. The creation of this record is based on the scribe's personal observations and the provider's statements to them. This document has been checked and approved by the attending provider.

## 2022-01-28 ENCOUNTER — Encounter: Payer: Self-pay | Admitting: Radiation Oncology

## 2022-01-28 ENCOUNTER — Ambulatory Visit
Admission: RE | Admit: 2022-01-28 | Discharge: 2022-01-28 | Disposition: A | Discharge status: Home / Self Care | Payer: BC Managed Care – PPO | Source: Ambulatory Visit | Attending: Radiation Oncology | Admitting: Radiation Oncology

## 2022-01-28 ENCOUNTER — Other Ambulatory Visit: Payer: Self-pay

## 2022-01-28 VITALS — BP 150/93 | HR 81 | Temp 97.6°F | Resp 18 | Ht 62.0 in | Wt 153.2 lb

## 2022-01-28 DIAGNOSIS — Z79899 Other long term (current) drug therapy: Secondary | ICD-10-CM | POA: Insufficient documentation

## 2022-01-28 DIAGNOSIS — Z923 Personal history of irradiation: Secondary | ICD-10-CM | POA: Insufficient documentation

## 2022-01-28 DIAGNOSIS — I890 Lymphedema, not elsewhere classified: Secondary | ICD-10-CM | POA: Insufficient documentation

## 2022-01-28 DIAGNOSIS — C50412 Malignant neoplasm of upper-outer quadrant of left female breast: Secondary | ICD-10-CM | POA: Insufficient documentation

## 2022-01-28 DIAGNOSIS — Z170 Estrogen receptor positive status [ER+]: Secondary | ICD-10-CM

## 2022-01-28 NOTE — Progress Notes (Signed)
Cindy [name] is here today for follow up post radiation to the breast.   Breast Side:left    They completed their radiation on: 04/06/2021   Does the patient complain of any of the following: Post radiation skin issues: denies Breast Tenderness: tenderness when there is swelling, but overall improving Breast Swelling: occasionally Lymphadema: not in arm but mild in left breast Range of Motion limitations: improving, demonstrates good range of motion Fatigue post radiation: mild which patient states is baseline Appetite good/fair/poor: Good  Additional comments if applicable: Denies  Vitals:   01/28/22 0831  BP: (!) 150/93  Pulse: 81  Resp: 18  Temp: 97.6 F (36.4 C)  TempSrc: Temporal  SpO2: 100%  Weight: 153 lb 4 oz (69.5 kg)  Height: 5\' 2"  (1.575 m)

## 2022-02-22 ENCOUNTER — Other Ambulatory Visit: Payer: Self-pay

## 2022-02-22 ENCOUNTER — Ambulatory Visit: Payer: BC Managed Care – PPO | Attending: Nurse Practitioner

## 2022-02-22 VITALS — Wt 154.0 lb

## 2022-02-22 DIAGNOSIS — Z483 Aftercare following surgery for neoplasm: Secondary | ICD-10-CM | POA: Insufficient documentation
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
## 2022-03-16 IMAGING — MG DIGITAL DIAGNOSTIC BILAT W/ TOMO W/ CAD
8 of 12 series · 8 of 32 positions shown · non-contrast
Comparison: Previous exam(s).

CLINICAL DATA: Patient with history of left breast lumpectomy
[DATE].

EXAM:
DIGITAL DIAGNOSTIC BILATERAL MAMMOGRAM WITH TOMOSYNTHESIS AND CAD
TECHNIQUE: Bilateral digital diagnostic mammography and breast tomosynthesis
was performed. The images were evaluated with computer-aided
detection.

[L MLO (1 of 2)]
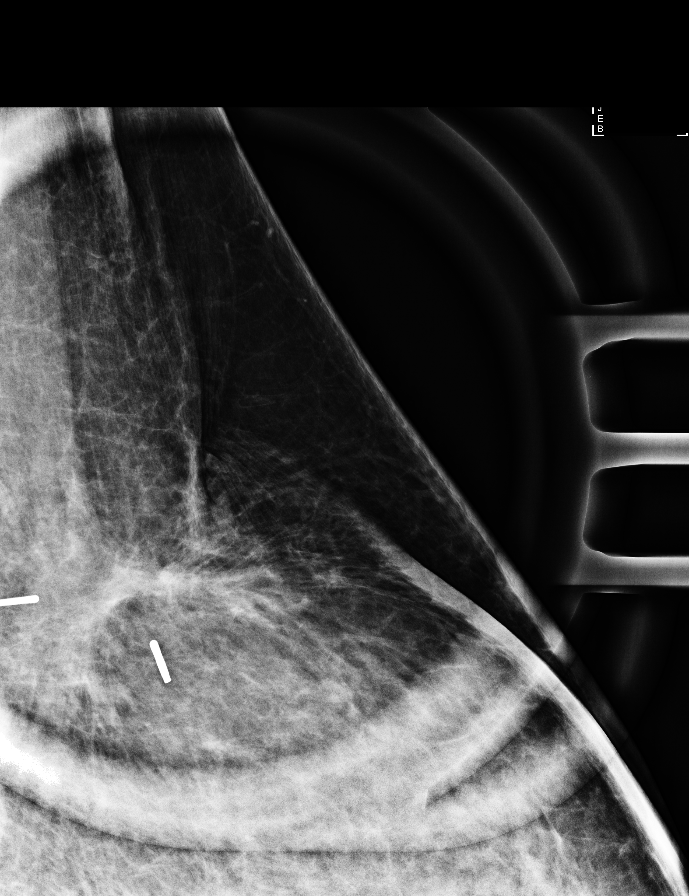

[L MLO (2 of 2)]
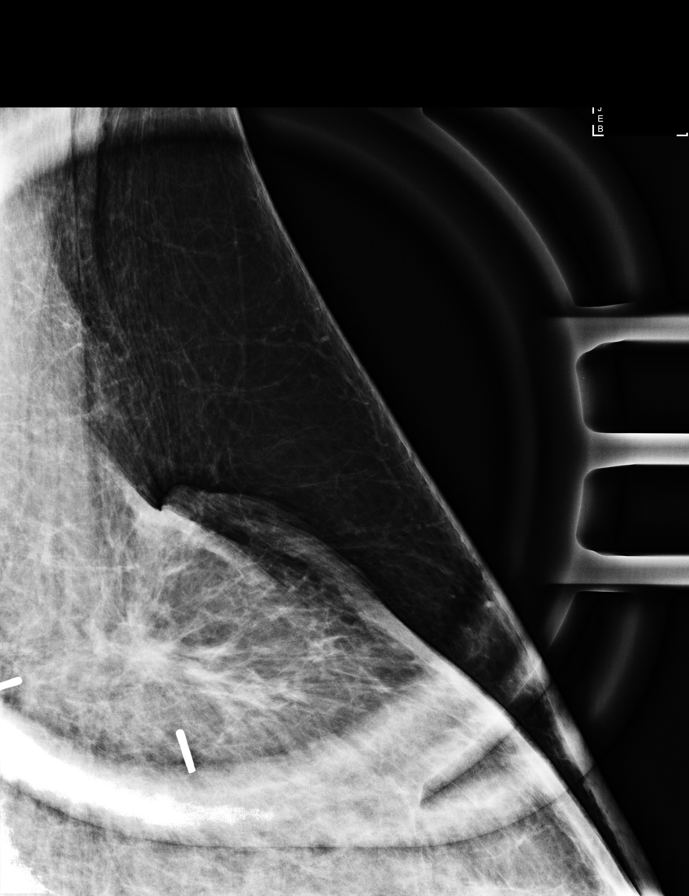

[R CC synth-2D]
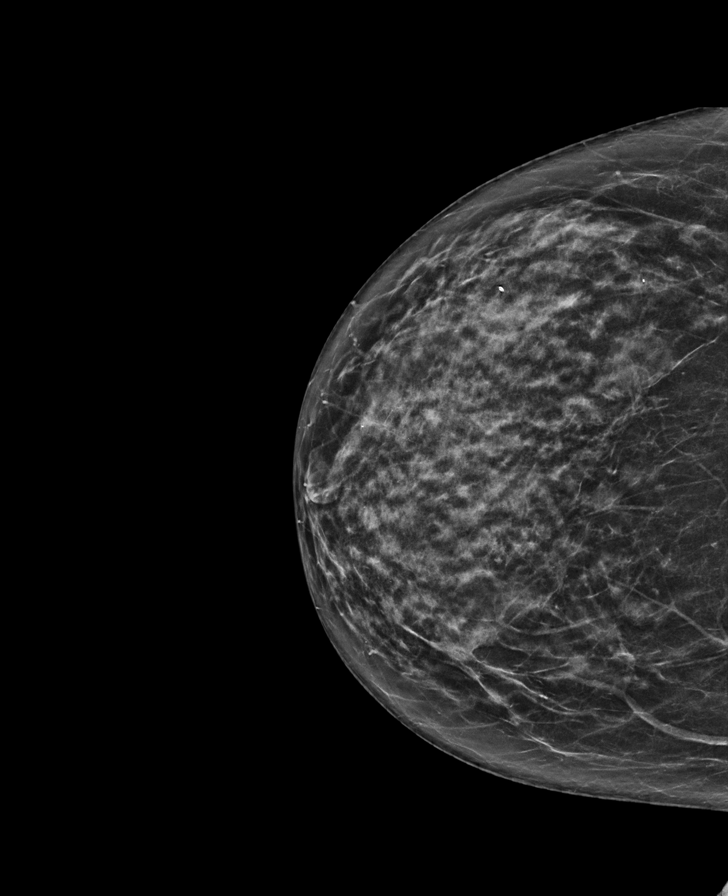

[L MLO synth-2D]
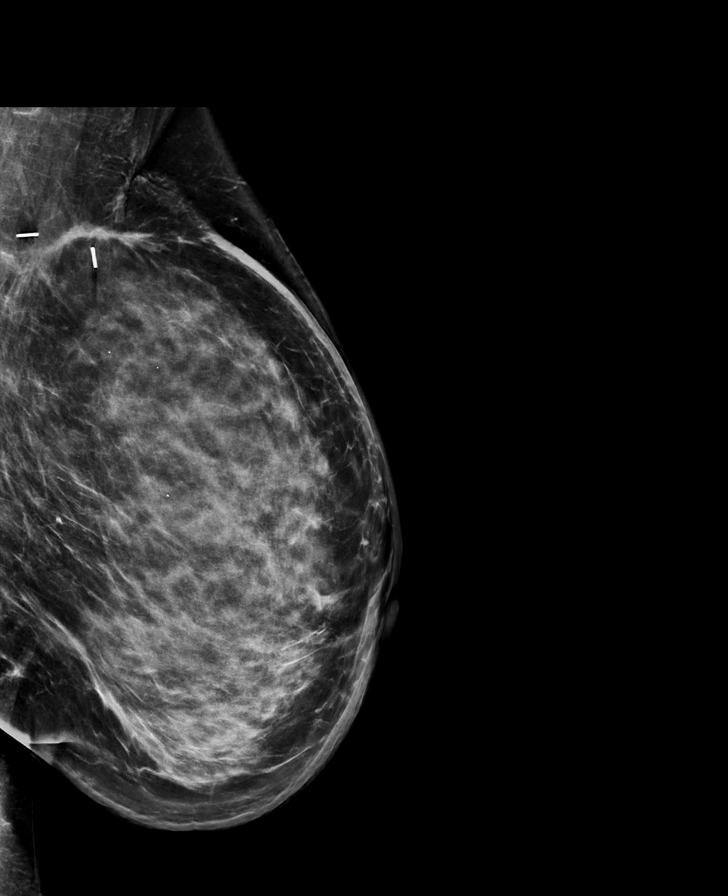

[R MLO synth-2D]
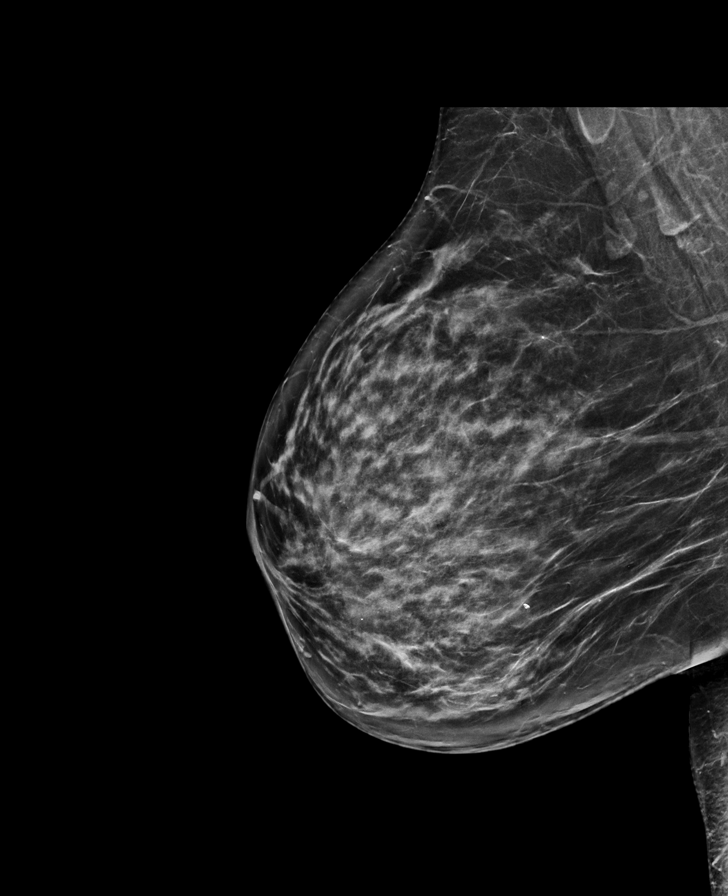

[L CC synth-2D]
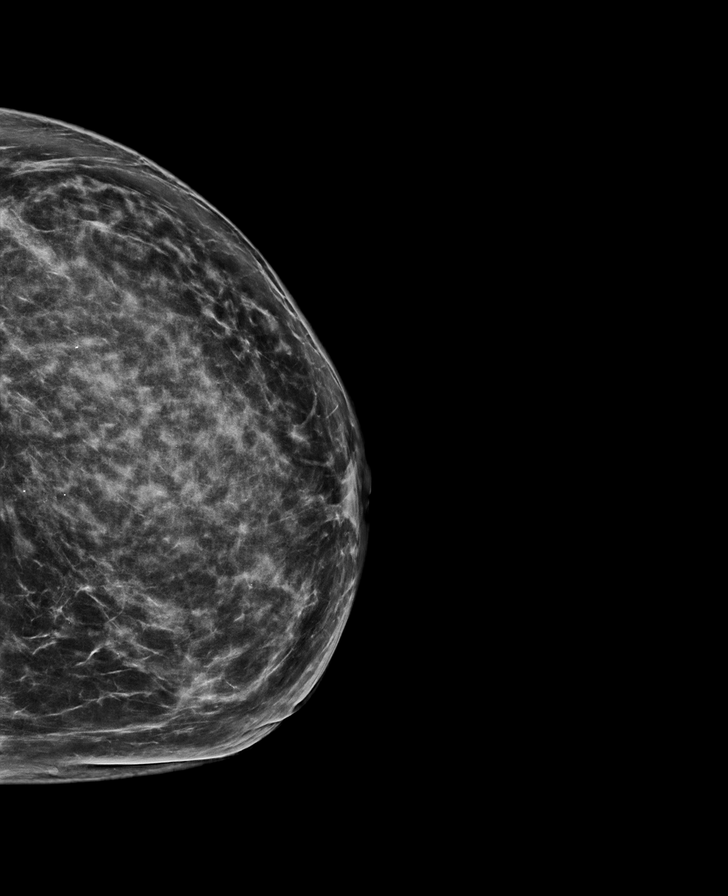

[L XCCL synth-2D]
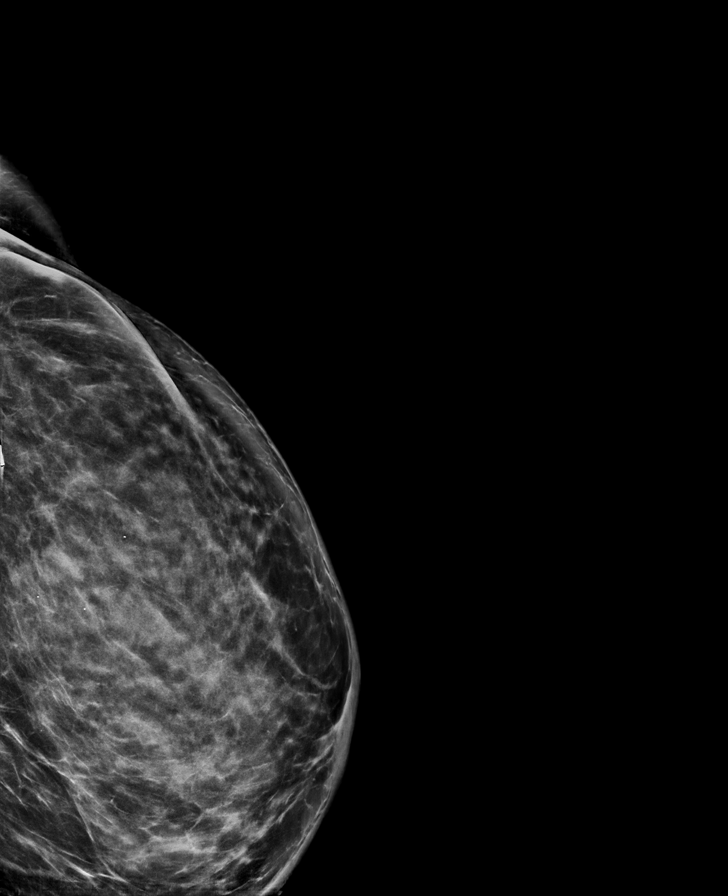

[L CC tomo · tomo slice 45/90.0]
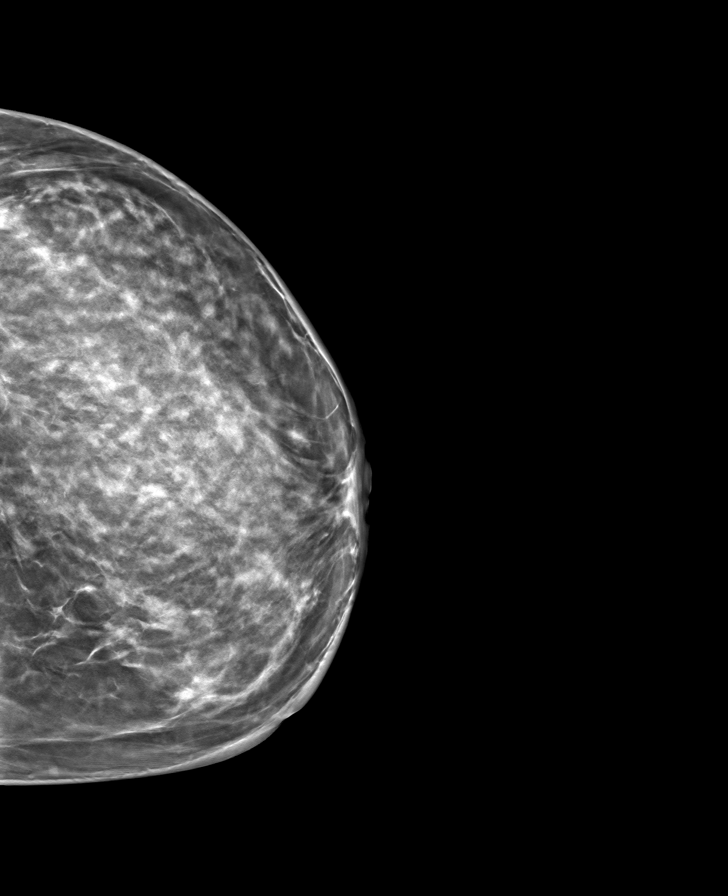

[8 of 32 positions shown; findings below may reference images not displayed]

ACR Breast Density Category c: The breast tissue is heterogeneously
dense, which may obscure small masses.
FINDINGS: Interval postlumpectomy changes left breast. No new masses,
calcifications or nonsurgical distortion identified within either
breast.
IMPRESSION: No mammographic evidence for malignancy. Interval lumpectomy changes
left breast.

RECOMMENDATION:
Bilateral diagnostic mammography 1 year.

I have discussed the findings and recommendations with the patient.
If applicable, a reminder letter will be sent to the patient
regarding the next appointment.

BI-RADS CATEGORY  2: Benign.

## 2022-03-29 ENCOUNTER — Inpatient Hospital Stay: Payer: BC Managed Care – PPO | Attending: Nurse Practitioner

## 2022-03-29 ENCOUNTER — Other Ambulatory Visit: Payer: Self-pay

## 2022-03-29 DIAGNOSIS — K219 Gastro-esophageal reflux disease without esophagitis: Secondary | ICD-10-CM | POA: Insufficient documentation

## 2022-03-29 DIAGNOSIS — I890 Lymphedema, not elsewhere classified: Secondary | ICD-10-CM | POA: Insufficient documentation

## 2022-03-29 DIAGNOSIS — Z9221 Personal history of antineoplastic chemotherapy: Secondary | ICD-10-CM | POA: Insufficient documentation

## 2022-03-29 DIAGNOSIS — C50412 Malignant neoplasm of upper-outer quadrant of left female breast: Secondary | ICD-10-CM | POA: Diagnosis present

## 2022-03-29 DIAGNOSIS — Z79810 Long term (current) use of selective estrogen receptor modulators (SERMs): Secondary | ICD-10-CM | POA: Diagnosis not present

## 2022-03-29 DIAGNOSIS — Z923 Personal history of irradiation: Secondary | ICD-10-CM | POA: Insufficient documentation

## 2022-03-29 DIAGNOSIS — F419 Anxiety disorder, unspecified: Secondary | ICD-10-CM | POA: Diagnosis not present

## 2022-03-29 DIAGNOSIS — Z170 Estrogen receptor positive status [ER+]: Secondary | ICD-10-CM | POA: Insufficient documentation

## 2022-03-29 DIAGNOSIS — R232 Flushing: Secondary | ICD-10-CM | POA: Diagnosis not present

## 2022-03-29 LAB — CBC WITH DIFFERENTIAL (CANCER CENTER ONLY)
Abs Immature Granulocytes: 0.01 10*3/uL (ref 0.00–0.07)
Basophils Absolute: 0 10*3/uL (ref 0.0–0.1)
Basophils Relative: 1 %
Eosinophils Absolute: 0.1 10*3/uL (ref 0.0–0.5)
Eosinophils Relative: 2 %
HCT: 37.7 % (ref 36.0–46.0)
Hemoglobin: 12.4 g/dL (ref 12.0–15.0)
Immature Granulocytes: 0 %
Lymphocytes Relative: 28 %
Lymphs Abs: 1 10*3/uL (ref 0.7–4.0)
MCH: 30.3 pg (ref 26.0–34.0)
MCHC: 32.9 g/dL (ref 30.0–36.0)
MCV: 92.2 fL (ref 80.0–100.0)
Monocytes Absolute: 0.3 10*3/uL (ref 0.1–1.0)
Monocytes Relative: 8 %
Neutro Abs: 2.1 10*3/uL (ref 1.7–7.7)
Neutrophils Relative %: 61 %
Platelet Count: 229 10*3/uL (ref 150–400)
RBC: 4.09 MIL/uL (ref 3.87–5.11)
RDW: 12.3 % (ref 11.5–15.5)
WBC Count: 3.5 10*3/uL — ABNORMAL LOW (ref 4.0–10.5)
nRBC: 0 % (ref 0.0–0.2)

## 2022-03-29 LAB — CMP (CANCER CENTER ONLY)
ALT: 5 U/L (ref 0–44)
AST: 12 U/L — ABNORMAL LOW (ref 15–41)
Albumin: 4.7 g/dL (ref 3.5–5.0)
Alkaline Phosphatase: 52 U/L (ref 38–126)
Anion gap: 10 (ref 5–15)
BUN: 7 mg/dL (ref 6–20)
CO2: 28 mmol/L (ref 22–32)
Calcium: 9.9 mg/dL (ref 8.9–10.3)
Chloride: 104 mmol/L (ref 98–111)
Creatinine: 0.82 mg/dL (ref 0.44–1.00)
GFR, Estimated: >60 mL/min (ref >60)
Glucose, Bld: 99 mg/dL (ref 70–99)
Potassium: 4.4 mmol/L (ref 3.5–5.1)
Sodium: 142 mmol/L (ref 135–145)
Total Bilirubin: 0.7 mg/dL (ref 0.3–1.2)
Total Protein: 7.8 g/dL (ref 6.5–8.1)

## 2022-03-30 LAB — FOLLICLE STIMULATING HORMONE: FSH: 25.5 m[IU]/mL

## 2022-03-30 NOTE — Progress Notes (Addendum)
?[facility]   ?Telephone:(336) [phone] [phone]   ?Clinic Follow up Note  ? ?Patient Care Team: ?Ellouise Newer as PCP - General (Physician Assistant) ?[facility] [employer] ?Mcarthur Rossetti, MD as Consulting Physician (Orthopedic Surgery) ?Mauro Kaufmann, RN as Oncology Nurse Navigator ?Rockwell Germany, RN as Oncology Nurse Navigator ?Rolm Bookbinder, MD as Consulting Physician (General Surgery) ?Truitt Merle, MD as Consulting Physician (Hematology) ?Alla Feeling, NP as Nurse Practitioner (Nurse Practitioner) ?Gery Pray, MD as Consulting Physician (Radiation Oncology) ?03/31/2022 ? ?CHIEF COMPLAINT: Follow-up left breast cancer ? ?SUMMARY OF ONCOLOGIC HISTORY: ?Oncology History Overview Note  ?Cancer Staging ?Malignant neoplasm of upper-outer quadrant of left breast in female, estrogen receptor positive ([place]) ?Staging form: Breast, AJCC 8th Edition ?- Clinical stage from 01/01/2021: Stage IA (cT1c, cN0, cM0, G2, ER+, PR+, HER2-) - Signed by Truitt Merle, MD on 01/20/2021 ?Stage prefix: Initial diagnosis ? ?  ?Malignant neoplasm of upper-outer quadrant of left breast in female, estrogen receptor positive ([place])  ?01/01/2021 Mammogram  ? Mammogram 01/01/21  ?IMPRESSION: ?1. 11x7x8 mm mass in the 1 o'clock position of the left breast 8cmfn, highly ?suspicious for breast malignancy. No left axillary lymphadenopathy. ?  ?01/01/2021 Initial Biopsy  ? Diagnosis 01/01/21 ?Breast, left, needle core biopsy, 1 o'clock, upper outer quadrant, ribbon clip ?- INVASIVE MAMMARY CARCINOMA, GRADE II. ?- SEE MICROSCOPIC DESCRIPTION. ?Microscopic Comment ?The greatest tumor length is 0.9 cm. E-cadherin and breast prognostic profile will be performed. ?Immunohistochemistry for E-cadherin is positive consistent with ductal carcinoma.  ?  ?01/01/2021 Receptors her2  ? PROGNOSTIC INDICATORS ?Results: ?IMMUNOHISTOCHEMICAL AND MORPHOMETRIC ANALYSIS PERFORMED MANUALLY ?The tumor cells are EQUIVOCAL for  Her2 (2+). ?HER2 by FISH will be performed and the results reported separately ?Estrogen Receptor: 95%, POSITIVE, STRONG STAINING INTENSITY ?Progesterone Receptor: 50%, POSITIVE, MODERATE STAINING INTENSITY ?Proliferation Marker Ki67: 20% ?FLUORESCENCE IN-SITU HYBRIDIZATION ?Results: ?GROUP 5: HER2 **NEGATIVE** ?Equivocal form of amplification of the HER2 gene was detected in the IHC 2+ tissue sample received from this ?individual. HER2 FISH was performed by a technologist and cell imaging and analysis on the [employer]. ?  ?01/01/2021 Cancer Staging  ? Staging form: Breast, AJCC 8th Edition ?- Clinical stage from 01/01/2021: Stage IA (cT1c, cN0, cM0, G2, ER+, PR+, HER2-) - Signed by Truitt Merle, MD on 01/20/2021 ? ?  ?01/16/2021 Initial Diagnosis  ? Malignant neoplasm of upper-outer quadrant of left breast in female, estrogen receptor positive ([place]) ?  ?01/28/2021 Surgery  ? LEFT BREAST LUMPECTOMY WITH RADIOACTIVE SEED AND LEFT AXILLARY SENTINEL LYMPH NODE BIOPSY by Dr Donne Hazel  ?  ?01/28/2021 Pathology Results  ? FINAL MICROSCOPIC DIAGNOSIS:  ? ?A. BREAST, LEFT W/SEED, LUMPECTOMY:  ?-  Invasive ductal carcinoma, Nottingham grade 2 of 3, 1.2 cm  ?-  Ductal carcinoma in-situ, intermediate grade  ?-  Calcifications associated with carcinoma  ?-  Margins uninvolved by carcinoma (see part B-E for final margin  ?status)  ?-  Previous biopsy site changes present  ?-  See oncology table below  ? ?B. BREAST, LEFT ADDITIONAL MEDIAL MARGIN, EXCISION:  ?-  No residual carcinoma identified  ? ?C. BREAST, LEFT ADDITIONAL POSTERIOR MARGIN, EXCISION:  ?-  No residual carcinoma identified  ? ?D. BREAST, LEFT ADDITIONAL LATERAL MARGIN, EXCISION:  ?-  No residual carcinoma  ? ?E. BREAST, LEFT ADDITIONAL SUPERIOR MARGIN, EXCISION:  ?-  No residual carcinoma identified  ?-  See comment  ? ?F. SENTINEL LYMPH NODE, LEFT AXILLARY, BIOPSY:  ?-  No carcinoma identified in one  lymph node (0/1)  ?-  See comment  ? ?G. SENTINEL LYMPH NODE, LEFT AXILLARY,  BIOPSY:  ?-  No carcinoma identified in one lymph node (0/1)  ?-  See comment  ? ?H. SENTINEL LYMPH NODE, LEFT AXILLARY, BIOPSY:  ?-  No carcinoma identified in one lymph node (0/1)  ?-  See comment  ? ?COMMENT:  ? ?E.  There is cauterized epithelium which by immunohistochemistry  ?maintains a myoepithelial layer (positive for SMM and calponin).  ?Cytokeratin AE1/3 does not highlight an infiltrative epithelioid  ?component.  ? ?F-H.  Given the lobular-like morphology, cytokeratin AE1/3 was performed  ?on the lymph nodes to exclude isolated tumor cells in micrometastasis.  ?Cytokeratin AE1/3 is negative. ?  ?01/28/2021 Oncotype testing  ? Recurrence score 23 with distant recurrence risk of 9 years with AI/Tamoxifen at 9% ?She has less than 1% benefit of chemotherapy.  ?  ?01/28/2021 Cancer Staging  ? Staging form: Breast, AJCC 8th Edition ?- Pathologic stage from 01/28/2021: Stage IA (pT1c, pN0, cM0, G2, ER+, PR+, HER2-, Oncotype DX score: 23) - Signed by Truitt Merle, MD on 04/03/2021 ?Stage prefix: Initial diagnosis ?Multigene prognostic tests performed: Oncotype DX ?Recurrence score range: Greater than or equal to 11 ?Histologic grading system: 3 grade system ?Residual tumor (R): R0 - None ? ?  ?03/09/2021 - 04/06/2021 Radiation Therapy  ? Adjuvant radiation with Dr Sondra Come  ?  ?03/2021 -  Anti-estrogen oral therapy  ? Tamoxifen 63m once daily starting in late April or Early May 2022.  ?  ?07/02/2021 Survivorship  ? SCP delivered by LCira Rue NP  ?  ? ? ?CURRENT THERAPY: Tamoxifen 20 mg starting 04/2021 ? ?INTERVAL HISTORY: Ms. PSocarrasreturns for follow-up as scheduled, last seen by Dr. FBurr Medico10/06/2021.  Mammogram 12/17/2021 showed breast density category C, interval postlumpectomy changes, and no evidence of malignancy.  She questions whether a mammogram is good enough for her dense breasts and is interested in an MRI.  PT is helping left breast lymphedema.  She has occasional soreness at the axillary incision when breast  swells.  Denies new lump/mass, nipple discharge or inversion, or skin change.  She is tolerating tamoxifen.  Hot flashes remain moderate to severe and keep her restless at night.  She did not try Effexor, does not like taking a lot of medicine.  She can manage this for now.  She has vaginal dryness, denies bleeding.  Denies signs of thrombosis, fever, chills, dyspnea, change in bowel habits, new pain, or any other complaints. ? ?All other systems were reviewed with the patient and are negative. ? ?MEDICAL HISTORY:  ?Past Medical History:  ?Diagnosis Date  ? Anxiety   ? Breast cancer ([place] 12/2020  ? left- + radiation, no chemotherapy  ? Elevated blood pressure reading   ? [place], [place] meds , no HTN  ? GERD (gastroesophageal reflux disease)   ? OCC ? from Tamoxifen  ? History of radiation therapy 03/09/2021-04/06/2021  ? left breast    Dr JGery Pray ? Medical history non-contributory   ? PONV (postoperative nausea and vomiting)   ? ? ?SURGICAL HISTORY: ?Past Surgical History:  ?Procedure Laterality Date  ? BREAST LUMPECTOMY WITH RADIOACTIVE SEED AND SENTINEL LYMPH NODE BIOPSY Left 01/28/2021  ? Procedure: LEFT BREAST LUMPECTOMY WITH RADIOACTIVE SEED AND LEFT AXILLARY SENTINEL LYMPH NODE BIOPSY;  Surgeon: WRolm Bookbinder MD;  [facility]  Service: General;  Laterality: Left;  PEC BLOCK  ? BREAST SURGERY Left   ?  breast biopsy  ? EXCISION OF BREAST BIOPSY    ? ? ?I have reviewed the social history and family history with the patient and they are unchanged from previous note. ? ?ALLERGIES:  is allergic to latex. ? ?MEDICATIONS:  ?Current Outpatient Medications  ?Medication Sig Dispense Refill  ? acetaminophen (TYLENOL) 500 MG tablet Take 500 mg by mouth as needed.    ? Ibuprofen 200 MG CAPS Take 400 mg by mouth as needed.    ? tamoxifen (NOLVADEX) 20 MG tablet Take 1 tablet (20 mg total) by mouth daily. 90 tablet 3  ? ?No current facility-administered medications for this  visit.  ? ? ?PHYSICAL EXAMINATION: ?ECOG PERFORMANCE STATUS: 1 - Symptomatic but completely ambulatory ? ?Vitals:  ? 03/31/22 0905  ?BP: (!) 154/88  ?Pulse: 79  ?Resp: 16  ?Temp: 97.9 ?F (36.6 ?C)  ?SpO2: 100

## 2022-03-31 ENCOUNTER — Inpatient Hospital Stay (HOSPITAL_BASED_OUTPATIENT_CLINIC_OR_DEPARTMENT_OTHER): Payer: BC Managed Care – PPO | Admitting: Nurse Practitioner

## 2022-03-31 ENCOUNTER — Encounter: Payer: Self-pay | Admitting: Nurse Practitioner

## 2022-03-31 ENCOUNTER — Other Ambulatory Visit: Payer: Self-pay

## 2022-03-31 VITALS — BP 154/88 | HR 79 | Temp 97.9°F | Resp 16 | Wt 154.5 lb

## 2022-03-31 DIAGNOSIS — C50412 Malignant neoplasm of upper-outer quadrant of left female breast: Secondary | ICD-10-CM | POA: Diagnosis not present

## 2022-03-31 DIAGNOSIS — Z170 Estrogen receptor positive status [ER+]: Secondary | ICD-10-CM

## 2022-05-04 ENCOUNTER — Encounter (HOSPITAL_COMMUNITY): Payer: Self-pay

## 2022-05-19 ENCOUNTER — Other Ambulatory Visit: Payer: Self-pay

## 2022-05-20 ENCOUNTER — Other Ambulatory Visit: Payer: Self-pay

## 2022-05-31 ENCOUNTER — Ambulatory Visit: Payer: BC Managed Care – PPO | Attending: Nurse Practitioner

## 2022-05-31 VITALS — Wt 157.5 lb

## 2022-05-31 DIAGNOSIS — Z483 Aftercare following surgery for neoplasm: Secondary | ICD-10-CM | POA: Insufficient documentation
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
## 2022-06-10 ENCOUNTER — Ambulatory Visit (HOSPITAL_COMMUNITY)
Admission: RE | Admit: 2022-06-10 | Discharge: 2022-06-10 | Disposition: A | Discharge status: Home / Self Care | Payer: BC Managed Care – PPO | Source: Ambulatory Visit | Attending: Nurse Practitioner | Admitting: Nurse Practitioner

## 2022-06-10 DIAGNOSIS — C50412 Malignant neoplasm of upper-outer quadrant of left female breast: Secondary | ICD-10-CM | POA: Insufficient documentation

## 2022-06-10 DIAGNOSIS — Z170 Estrogen receptor positive status [ER+]: Secondary | ICD-10-CM | POA: Insufficient documentation

## 2022-06-10 MED ORDER — GADOBUTROL 1 MMOL/ML IV SOLN
7.0000 mL | Freq: Once | INTRAVENOUS | Status: AC | PRN
  Administered 2022-06-10: 7.5 mL via INTRAVENOUS

## 2022-07-05 ENCOUNTER — Other Ambulatory Visit: Payer: Self-pay | Admitting: Nurse Practitioner

## 2022-09-20 ENCOUNTER — Ambulatory Visit: Payer: BC Managed Care – PPO | Attending: General Surgery

## 2022-09-20 VITALS — Wt 154.5 lb

## 2022-09-20 DIAGNOSIS — Z483 Aftercare following surgery for neoplasm: Secondary | ICD-10-CM | POA: Insufficient documentation
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
## 2022-09-28 ENCOUNTER — Telehealth: Payer: Self-pay | Admitting: Hematology

## 2022-09-28 NOTE — Telephone Encounter (Signed)
Contacted patient to scheduled appointments. Patient is aware of appointments that are scheduled.   

## 2022-09-30 ENCOUNTER — Inpatient Hospital Stay: Payer: BC Managed Care – PPO

## 2022-10-04 ENCOUNTER — Inpatient Hospital Stay: Payer: BC Managed Care – PPO | Admitting: Hematology

## 2022-10-15 ENCOUNTER — Other Ambulatory Visit: Payer: Self-pay

## 2022-10-15 DIAGNOSIS — C50412 Malignant neoplasm of upper-outer quadrant of left female breast: Secondary | ICD-10-CM

## 2022-10-18 ENCOUNTER — Inpatient Hospital Stay: Payer: BC Managed Care – PPO | Attending: Hematology

## 2022-10-18 DIAGNOSIS — Z853 Personal history of malignant neoplasm of breast: Secondary | ICD-10-CM | POA: Diagnosis present

## 2022-10-18 DIAGNOSIS — Z923 Personal history of irradiation: Secondary | ICD-10-CM | POA: Insufficient documentation

## 2022-10-18 DIAGNOSIS — C50412 Malignant neoplasm of upper-outer quadrant of left female breast: Secondary | ICD-10-CM

## 2022-10-18 LAB — CBC WITH DIFFERENTIAL (CANCER CENTER ONLY)
Abs Immature Granulocytes: 0.02 10*3/uL (ref 0.00–0.07)
Basophils Absolute: 0 10*3/uL (ref 0.0–0.1)
Basophils Relative: 1 %
Eosinophils Absolute: 0.1 10*3/uL (ref 0.0–0.5)
Eosinophils Relative: 3 %
HCT: 37.2 % (ref 36.0–46.0)
Hemoglobin: 12.5 g/dL (ref 12.0–15.0)
Immature Granulocytes: 1 %
Lymphocytes Relative: 33 %
Lymphs Abs: 1.4 10*3/uL (ref 0.7–4.0)
MCH: 30.9 pg (ref 26.0–34.0)
MCHC: 33.6 g/dL (ref 30.0–36.0)
MCV: 91.9 fL (ref 80.0–100.0)
Monocytes Absolute: 0.4 10*3/uL (ref 0.1–1.0)
Monocytes Relative: 10 %
Neutro Abs: 2.3 10*3/uL (ref 1.7–7.7)
Neutrophils Relative %: 52 %
Platelet Count: 230 10*3/uL (ref 150–400)
RBC: 4.05 MIL/uL (ref 3.87–5.11)
RDW: 12 % (ref 11.5–15.5)
WBC Count: 4.3 10*3/uL (ref 4.0–10.5)
nRBC: 0 % (ref 0.0–0.2)

## 2022-10-18 LAB — CMP (CANCER CENTER ONLY)
ALT: 8 U/L (ref 0–44)
AST: 13 U/L — ABNORMAL LOW (ref 15–41)
Albumin: 4.6 g/dL (ref 3.5–5.0)
Alkaline Phosphatase: 51 U/L (ref 38–126)
Anion gap: 8 (ref 5–15)
BUN: 13 mg/dL (ref 6–20)
CO2: 27 mmol/L (ref 22–32)
Calcium: 9.6 mg/dL (ref 8.9–10.3)
Chloride: 103 mmol/L (ref 98–111)
Creatinine: 0.9 mg/dL (ref 0.44–1.00)
GFR, Estimated: >60 mL/min (ref >60)
Glucose, Bld: 97 mg/dL (ref 70–99)
Potassium: 4.7 mmol/L (ref 3.5–5.1)
Sodium: 138 mmol/L (ref 135–145)
Total Bilirubin: 0.6 mg/dL (ref 0.3–1.2)
Total Protein: 7.5 g/dL (ref 6.5–8.1)

## 2022-10-19 LAB — FOLLICLE STIMULATING HORMONE: FSH: 20.8 m[IU]/mL

## 2022-10-21 ENCOUNTER — Encounter: Payer: Self-pay | Admitting: Hematology

## 2022-10-21 ENCOUNTER — Inpatient Hospital Stay (HOSPITAL_BASED_OUTPATIENT_CLINIC_OR_DEPARTMENT_OTHER): Payer: BC Managed Care – PPO | Admitting: Hematology

## 2022-10-21 VITALS — BP 159/88 | HR 76 | Temp 98.2°F | Resp 18 | Ht 62.0 in | Wt 157.9 lb

## 2022-10-21 DIAGNOSIS — C50412 Malignant neoplasm of upper-outer quadrant of left female breast: Secondary | ICD-10-CM

## 2022-10-21 DIAGNOSIS — Z170 Estrogen receptor positive status [ER+]: Secondary | ICD-10-CM | POA: Diagnosis not present

## 2022-10-21 DIAGNOSIS — Z1211 Encounter for screening for malignant neoplasm of colon: Secondary | ICD-10-CM

## 2022-10-21 DIAGNOSIS — Z853 Personal history of malignant neoplasm of breast: Secondary | ICD-10-CM | POA: Diagnosis not present

## 2022-10-21 NOTE — Progress Notes (Signed)
[place]   [phone] [phone]   Clinic Follow up Note   Patient Care Team: Lorrene Reid, PA-C as PCP - [facility] (Physician Assistant) [facility], [facility] Mcarthur Rossetti, MD as Consulting Physician (Orthopedic Surgery) Mauro Kaufmann, RN as Oncology Nurse Navigator Rockwell Germany, RN as Oncology Nurse Navigator Rolm Bookbinder, MD as Consulting Physician (General Surgery) Truitt Merle, MD as Consulting Physician (Hematology) Alla Feeling, NP as Nurse Practitioner (Nurse Practitioner) Gery Pray, MD as Consulting Physician (Radiation Oncology)  Date of Service:  10/21/2022  CHIEF COMPLAINT: f/u of left breast cancer  CURRENT THERAPY:  Tamoxifen, starting 04/2021  ASSESSMENT & PLAN:  Cindy [name] is a 51 y.o. peri-menopausal female with   1. Malignant neoplasm of upper-outer quadrant of left breast, Stage IA, pT1cN0M0, ER+/PR+/HER2-, Grade II, RS 23 -diagnosed 12/2020, s/p left lumpectomy 01/28/21 by Dr. Donne Hazel, path showed 1.2cm IDC, grade 2 and intermediate grade DCIS. Margins and LN negative. RS of 23, distant recurrence risk of 9%. -s/p radiation with Dr Sondra Come 03/09/21-04/06/21.  -she began tamoxifen in late April/early May 2022. She is tolerating well overall with improving hot flashes and vaginal dryness. I provided her with a sample of a water-based lubricant. Her most recent [facility] from 10/18/22 showed she is still peri-menopausal. -most recent mammogram on 12/17/21 and breast MRI on 06/10/22 were both benign. -she is clinically doing well. Labs from 10/23 reviewed, all WNL. Physical exam was unremarkable. There is no clinical concern for recurrence. -f/u in 6 months   PLAN: -mammogram due 11/2022 -lab and f/u with NP Lacie in 6 months -breast MRI in 05/2023 -Continue tamoxifen   No problem-specific Assessment & Plan notes found for this encounter.   SUMMARY OF ONCOLOGIC HISTORY: Oncology History  Overview Note  Cancer Staging Malignant neoplasm of upper-outer quadrant of left breast in female, estrogen receptor positive ([place]) Staging form: Breast, AJCC 8th Edition - Clinical stage from 01/01/2021: Stage IA (cT1c, cN0, cM0, G2, ER+, PR+, HER2-) - Signed by Truitt Merle, MD on 01/20/2021 Stage prefix: Initial diagnosis    Malignant neoplasm of upper-outer quadrant of left breast in female, estrogen receptor positive ([place])  01/01/2021 Mammogram   Mammogram 01/01/21  IMPRESSION: 1. 11x7x8 mm mass in the 1 o'clock position of the left breast 8cmfn, highly suspicious for breast malignancy. No left axillary lymphadenopathy.   01/01/2021 Initial Biopsy   Diagnosis 01/01/21 Breast, left, needle core biopsy, 1 o'clock, upper outer quadrant, ribbon clip - INVASIVE MAMMARY CARCINOMA, GRADE II. - SEE MICROSCOPIC DESCRIPTION. Microscopic Comment The greatest tumor length is 0.9 cm. E-cadherin and breast prognostic profile will be performed. 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    01/01/2021 Receptors her2   PROGNOSTIC INDICATORS Results: IMMUNOHISTOCHEMICAL AND MORPHOMETRIC ANALYSIS PERFORMED MANUALLY The tumor cells are EQUIVOCAL for Her2 (2+). HER2 by FISH will be performed and the results reported separately Estrogen Receptor: 95%, POSITIVE, STRONG STAINING INTENSITY Progesterone Receptor: 50%, POSITIVE, MODERATE STAINING INTENSITY Proliferation Marker Ki67: 20% FLUORESCENCE IN-SITU HYBRIDIZATION Results: GROUP 5: HER2 **NEGATIVE** Equivocal form of amplification of the HER2 gene was detected in the IHC 2+ tissue sample received from this individual. HER2 FISH was performed by a technologist and cell imaging and analysis on the [employer].   01/01/2021 Cancer Staging   Staging form: Breast, AJCC 8th Edition - Clinical stage from 01/01/2021: Stage IA (cT1c, cN0, cM0, G2, ER+, PR+, HER2-) - Signed by Truitt Merle, MD on 01/20/2021   01/16/2021 Initial Diagnosis  Malignant neoplasm of upper-outer quadrant of left breast in female, estrogen receptor positive ([place])   01/28/2021 Surgery   LEFT BREAST LUMPECTOMY WITH RADIOACTIVE SEED AND LEFT AXILLARY SENTINEL LYMPH NODE BIOPSY by Dr Donne Hazel    01/28/2021 Pathology Results   FINAL MICROSCOPIC DIAGNOSIS:   A. BREAST, LEFT W/SEED, LUMPECTOMY:  -  Invasive ductal carcinoma, Nottingham grade 2 of 3, 1.2 cm  -  Ductal carcinoma in-situ, intermediate grade  -  Calcifications associated with carcinoma  -  Margins uninvolved by carcinoma (see part B-E for final margin  status)  -  Previous biopsy site changes present  -  See oncology table below   B. BREAST, LEFT ADDITIONAL MEDIAL MARGIN, EXCISION:  -  No residual carcinoma identified   C. BREAST, LEFT ADDITIONAL POSTERIOR MARGIN, EXCISION:  -  No residual carcinoma identified   D. BREAST, LEFT ADDITIONAL LATERAL MARGIN, EXCISION:  -  No residual carcinoma   E. BREAST, LEFT ADDITIONAL SUPERIOR MARGIN, EXCISION:  -  No residual carcinoma identified  -  See comment   F. SENTINEL LYMPH NODE, LEFT AXILLARY, BIOPSY:  -  No carcinoma identified in one lymph node (0/1)  -  See comment   G. SENTINEL LYMPH NODE, LEFT AXILLARY, BIOPSY:  -  No carcinoma identified in one lymph node (0/1)  -  See comment   H. SENTINEL LYMPH NODE, LEFT AXILLARY, BIOPSY:  -  No carcinoma identified in one lymph node (0/1)  -  See comment   COMMENT:   E.  There is cauterized epithelium which by immunohistochemistry  maintains a myoepithelial layer (positive for SMM and calponin).  Cytokeratin AE1/3 does not highlight an infiltrative epithelioid  component.   F-H.  Given the lobular-like morphology, cytokeratin AE1/3 was performed  on the lymph nodes to exclude isolated tumor cells in micrometastasis.  Cytokeratin AE1/3 is negative.   01/28/2021 Oncotype testing   Recurrence score 23 with distant recurrence risk of 9 years with AI/Tamoxifen at 9% She has less than  1% benefit of chemotherapy.    01/28/2021 Cancer Staging   Staging form: Breast, AJCC 8th Edition - Pathologic stage from 01/28/2021: Stage IA (pT1c, pN0, cM0, G2, ER+, PR+, HER2-, Oncotype DX score: 23) - Signed by Truitt Merle, MD on 04/03/2021 Stage prefix: Initial diagnosis Multigene prognostic tests performed: Oncotype DX Recurrence score range: Greater than or equal to 11 Histologic grading system: 3 grade system Residual tumor (R): R0 - None   03/09/2021 - 04/06/2021 Radiation Therapy   Adjuvant radiation with Dr Sondra Come    03/2021 -  Anti-estrogen oral therapy   Tamoxifen 14m once daily starting in late April or Early May 2022.    07/02/2021 Survivorship   SCP delivered by LCira Rue NP       INTERVAL HISTORY:  Cindy Grimleyis here for a follow up of breast cancer. She was last seen by NP Lacie on 03/31/22. She presents to the clinic alone. She reports she is doing well overall. She reports her hot flashes are improving, but she still has vaginal dryness.   All other systems were reviewed with the patient and are negative.  MEDICAL HISTORY:  Past Medical History:  Diagnosis Date   Anxiety    [place]   left- + radiation, no chemotherapy   [place] , [place]   GERD (gastroesophageal reflux disease)    OCC ? from Tamoxifen   History of  radiation therapy 03/09/2021-04/06/2021   left breast    Dr Gery Pray   Medical history non-contributory    PONV (postoperative nausea and vomiting)     SURGICAL HISTORY: Past Surgical History:  Procedure Laterality Date   BREAST LUMPECTOMY WITH RADIOACTIVE SEED AND SENTINEL LYMPH NODE BIOPSY Left 01/28/2021   Procedure: LEFT BREAST LUMPECTOMY WITH RADIOACTIVE SEED AND LEFT AXILLARY SENTINEL LYMPH NODE BIOPSY;  Surgeon: Rolm Bookbinder, MD;  Location: [facility];  Service: General;  Laterality: Left;  PEC BLOCK   BREAST SURGERY Left    breast  biopsy   EXCISION OF BREAST BIOPSY      I have reviewed the social history and family history with the patient and they are unchanged from previous note.  ALLERGIES:  is allergic to latex.  MEDICATIONS:  Current Outpatient Medications  Medication Sig Dispense Refill   acetaminophen (TYLENOL) 500 MG tablet Take 500 mg by mouth as needed.     Ibuprofen 200 MG CAPS Take 400 mg by mouth as needed.     tamoxifen (NOLVADEX) 20 MG tablet TAKE 1 TABLET (20 MG TOTAL) BY MOUTH DAILY. 30 tablet 3   No current facility-administered medications for this visit.    PHYSICAL EXAMINATION: ECOG PERFORMANCE STATUS: 0 - Asymptomatic  Vitals:   10/21/22 0844  BP: (!) 159/88  Pulse: 76  Resp: 18  Temp: 98.2 F (36.8 C)  SpO2: 100%   Wt Readings from Last 3 Encounters:  10/21/22 157 lb 14.4 oz (71.6 kg)  09/20/22 154 lb 8 oz (70.1 kg)  05/31/22 157 lb 8 oz (71.4 kg)     GENERAL:alert, no distress and comfortable SKIN: skin color, texture, turgor are normal, no rashes or significant lesions EYES: normal, Conjunctiva are pink and non-injected, sclera clear  NECK: supple, thyroid normal size, non-tender, without nodularity LYMPH:  no palpable lymphadenopathy in the cervical, axillary LUNGS: clear to auscultation and percussion with normal breathing effort HEART: regular rate & rhythm and no murmurs and no lower extremity edema ABDOMEN:abdomen soft, non-tender and normal bowel sounds Musculoskeletal:no cyanosis of digits and no clubbing  NEURO: alert & oriented x 3 with fluent speech, no focal motor/sensory deficits BREAST: No palpable mass, nodules or adenopathy bilaterally. Breast exam benign.   LABORATORY DATA:  I have reviewed the data as listed    Latest Ref Rng & Units 10/18/2022    8:11 AM 03/29/2022    9:13 AM 10/02/2021    8:48 AM  CBC  WBC 4.0 - 10.5 K/uL 4.3  3.5  3.7   Hemoglobin 12.0 - 15.0 g/dL 12.5  12.4  12.5   Hematocrit 36.0 - 46.0 % 37.2  37.7  37.5   Platelets 150 -  400 K/uL 230  229  218         Latest Ref Rng & Units 10/18/2022    8:11 AM 03/29/2022    9:13 AM 10/02/2021    8:48 AM  CMP  Glucose 70 - 99 mg/dL 97  99  99   BUN 6 - 20 mg/dL '13  7  7   ' Creatinine 0.44 - 1.00 mg/dL 0.90  0.82  0.80   Sodium 135 - 145 mmol/L 138  142  139   Potassium 3.5 - 5.1 mmol/L 4.7  4.4  3.9   Chloride 98 - 111 mmol/L 103  104  103   CO2 22 - 32 mmol/L '27  28  26   ' Calcium 8.9 - 10.3 mg/dL 9.6  9.9  9.6   Total Protein 6.5 - 8.1 g/dL 7.5  7.8  7.6   Total Bilirubin 0.3 - 1.2 mg/dL 0.6  0.7  1.1   Alkaline Phos 38 - 126 U/L 51  52  60   AST 15 - 41 U/L '13  12  13   ' ALT 0 - 44 U/L '8  5  7       ' RADIOGRAPHIC STUDIES: I have personally reviewed the radiological images as listed and agreed with the findings in the report. No results found.    Orders Placed This Encounter  Procedures   MM DIAG BREAST TOMO BILATERAL    Standing Status:   Future    Standing Expiration Date:   10/22/2023    Order Specific Question:   Reason for Exam (SYMPTOM  OR DIAGNOSIS REQUIRED)    Answer:   screening    Order Specific Question:   Preferred imaging location?    Answer:   [facility]    Order Specific Question:   Is the patient pregnant?    Answer:   No   All questions were answered. The patient knows to call the clinic with any problems, questions or concerns. No barriers to learning was detected. The total time spent in the appointment was 25 minutes.     Truitt Merle, MD 10/21/2022   I, Wilburn Mylar, am acting as scribe for Truitt Merle, MD.   I have reviewed the above documentation for accuracy and completeness, and I agree with the above.

## 2022-11-04 ENCOUNTER — Other Ambulatory Visit: Payer: Self-pay | Admitting: Nurse Practitioner

## 2023-01-05 ENCOUNTER — Ambulatory Visit
Admission: RE | Admit: 2023-01-05 | Discharge: 2023-01-05 | Disposition: A | Discharge status: Home / Self Care | Payer: BC Managed Care – PPO | Source: Ambulatory Visit | Attending: Hematology | Admitting: Hematology

## 2023-01-05 DIAGNOSIS — Z170 Estrogen receptor positive status [ER+]: Secondary | ICD-10-CM

## 2023-01-05 HISTORY — DX: Personal history of irradiation: Z92.3

## 2023-03-03 ENCOUNTER — Other Ambulatory Visit: Payer: Self-pay | Admitting: Nurse Practitioner

## 2023-03-07 ENCOUNTER — Ambulatory Visit: Payer: BC Managed Care – PPO | Attending: General Surgery

## 2023-03-07 VITALS — Wt 160.4 lb

## 2023-03-07 DIAGNOSIS — Z483 Aftercare following surgery for neoplasm: Secondary | ICD-10-CM | POA: Insufficient documentation
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
## 2023-04-24 ENCOUNTER — Other Ambulatory Visit: Payer: Self-pay | Admitting: Nurse Practitioner

## 2023-04-24 DIAGNOSIS — C50412 Malignant neoplasm of upper-outer quadrant of left female breast: Secondary | ICD-10-CM

## 2023-04-24 NOTE — Progress Notes (Unsigned)
Patient Care Team: Mayer Masker, PA-C (Inactive) as PCP - [facility], [facility] Kathryne Hitch, MD as Consulting Physician (Orthopedic Surgery) Pershing Proud, RN as Oncology Nurse Navigator Donnelly Angelica, RN as Oncology Nurse Navigator Emelia Loron, MD as Consulting Physician (General Surgery) Malachy Mood, MD as Consulting Physician (Hematology) Pollyann Samples, NP as Nurse Practitioner (Nurse Practitioner) Antony Blackbird, MD as Consulting Physician (Radiation Oncology)   CHIEF COMPLAINT: Follow up left breast cancer   Oncology History Overview Note  Cancer Staging Malignant neoplasm of upper-outer quadrant of left breast in female, estrogen receptor positive (HCC) Staging form: Breast, AJCC 8th Edition - Clinical stage from 01/01/2021: Stage IA (cT1c, cN0, cM0, G2, ER+, PR+, HER2-) - Signed by Malachy Mood, MD on 01/20/2021 Stage prefix: Initial diagnosis    Malignant neoplasm of upper-outer quadrant of left breast in female, estrogen receptor positive (HCC)  01/01/2021 Mammogram   Mammogram 01/01/21  IMPRESSION: 1. 11x7x8 mm mass in the 1 o'clock position of the left breast 8cmfn, highly suspicious for breast malignancy. No left axillary lymphadenopathy.   01/01/2021 Initial Biopsy   Diagnosis 01/01/21 Breast, left, needle core biopsy, 1 o'clock, upper outer quadrant, ribbon clip - INVASIVE MAMMARY CARCINOMA, GRADE II. - SEE MICROSCOPIC DESCRIPTION. Microscopic Comment The greatest tumor length is 0.9 cm. E-cadherin and breast prognostic profile will be performed. 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    01/01/2021 Receptors her2   PROGNOSTIC INDICATORS Results: IMMUNOHISTOCHEMICAL AND MORPHOMETRIC ANALYSIS PERFORMED MANUALLY The tumor cells are EQUIVOCAL for Her2 (2+). HER2 by FISH will be performed and the results reported separately Estrogen Receptor: 95%, POSITIVE, STRONG  STAINING INTENSITY Progesterone Receptor: 50%, POSITIVE, MODERATE STAINING INTENSITY Proliferation Marker Ki67: 20% FLUORESCENCE IN-SITU HYBRIDIZATION Results: GROUP 5: HER2 **NEGATIVE** Equivocal form of amplification of the HER2 gene was detected in the IHC 2+ tissue sample received from this individual. HER2 FISH was performed by a technologist and cell imaging and analysis on the BioView.   01/01/2021 Cancer Staging   Staging form: Breast, AJCC 8th Edition - Clinical stage from 01/01/2021: Stage IA (cT1c, cN0, cM0, G2, ER+, PR+, HER2-) - Signed by Malachy Mood, MD on 01/20/2021   01/16/2021 Initial Diagnosis   Malignant neoplasm of upper-outer quadrant of left breast in female, estrogen receptor positive (HCC)   01/28/2021 Surgery   LEFT BREAST LUMPECTOMY WITH RADIOACTIVE SEED AND LEFT AXILLARY SENTINEL LYMPH NODE BIOPSY by Dr Dwain Sarna    01/28/2021 Pathology Results   FINAL MICROSCOPIC DIAGNOSIS:   A. BREAST, LEFT W/SEED, LUMPECTOMY:  -  Invasive ductal carcinoma, Nottingham grade 2 of 3, 1.2 cm  -  Ductal carcinoma in-situ, intermediate grade  -  Calcifications associated with carcinoma  -  Margins uninvolved by carcinoma (see part B-E for final margin  status)  -  Previous biopsy site changes present  -  See oncology table below   B. BREAST, LEFT ADDITIONAL MEDIAL MARGIN, EXCISION:  -  No residual carcinoma identified   C. BREAST, LEFT ADDITIONAL POSTERIOR MARGIN, EXCISION:  -  No residual carcinoma identified   D. BREAST, LEFT ADDITIONAL LATERAL MARGIN, EXCISION:  -  No residual carcinoma   E. BREAST, LEFT ADDITIONAL SUPERIOR MARGIN, EXCISION:  -  No residual carcinoma identified  -  See comment   F. SENTINEL LYMPH NODE, LEFT AXILLARY, BIOPSY:  -  No carcinoma identified in one lymph node (0/1)  -  See comment   G. SENTINEL LYMPH NODE, LEFT AXILLARY, BIOPSY:  -  No carcinoma identified in one lymph node (0/1)  -  See comment   H. SENTINEL LYMPH NODE, LEFT AXILLARY,  BIOPSY:  -  No carcinoma identified in one lymph node (0/1)  -  See comment   COMMENT:   E.  There is cauterized epithelium which by immunohistochemistry  maintains a myoepithelial layer (positive for SMM and calponin).  Cytokeratin AE1/3 does not highlight an infiltrative epithelioid  component.   F-H.  Given the lobular-like morphology, cytokeratin AE1/3 was performed  on the lymph nodes to exclude isolated tumor cells in micrometastasis.  Cytokeratin AE1/3 is negative.   01/28/2021 Oncotype testing   Recurrence score 23 with distant recurrence risk of 9 years with AI/Tamoxifen at 9% She has less than 1% benefit of chemotherapy.    01/28/2021 Cancer Staging   Staging form: Breast, AJCC 8th Edition - Pathologic stage from 01/28/2021: Stage IA (pT1c, pN0, cM0, G2, ER+, PR+, HER2-, Oncotype DX score: 23) - Signed by Malachy Mood, MD on 04/03/2021 Stage prefix: Initial diagnosis Multigene prognostic tests performed: Oncotype DX Recurrence score range: Greater than or equal to 11 Histologic grading system: 3 grade system Residual tumor (R): R0 - None   03/09/2021 - 04/06/2021 Radiation Therapy   Adjuvant radiation with Dr Roselind Messier    03/2021 -  Anti-estrogen oral therapy   Tamoxifen 20mg  once daily starting in late April or Early May 2022.    07/02/2021 Survivorship   SCP delivered by Santiago Glad, NP       CURRENT THERAPY:  Tamoxifen 20 mg daily, starting 04/2021   INTERVAL HISTORY Ms. Brindle returns for follow-up as scheduled, last seen by Dr. Mosetta Putt 10/21/2022.  Mammogram in January was negative. She has occasional mild right breast pain going on for about a year. Denies new lump/mass, nipple discharge or inversion, or skin change.  She drinks 3 cups of coffee every morning.  She continues tamoxifen, tolerating with occasional hot flash.  The pharmacy's supplier has changed to a flat round pill without coating, which leaves a bad aftertaste.  Gynecologist told her she does not have vaginal  dryness, but she does have has some discomfort and urinary frequency. No urgency or dysuria. She gets a "phantom period" with cramping and breast soreness just without the menstrual bleeding.    ROS  All other systems reviewed and negative  Past Medical History:  Diagnosis Date   Anxiety    Breast cancer (HCC) 12/2020   left- + radiation, no chemotherapy   Elevated blood pressure reading    only in Doctor's office, no meds , no HTN   GERD (gastroesophageal reflux disease)    OCC ? from Tamoxifen   History of radiation therapy 03/09/2021-04/06/2021   left breast    Dr Antony Blackbird   Medical history non-contributory    Personal history of radiation therapy    PONV (postoperative nausea and vomiting)      Past Surgical History:  Procedure Laterality Date   BREAST LUMPECTOMY     BREAST LUMPECTOMY WITH RADIOACTIVE SEED AND SENTINEL LYMPH NODE BIOPSY Left 01/28/2021   Procedure: LEFT BREAST LUMPECTOMY WITH RADIOACTIVE SEED AND LEFT AXILLARY SENTINEL LYMPH NODE BIOPSY;  Surgeon: Emelia Loron, MD;  Location: [facility];  Service: General;  Laterality: Left;  PEC BLOCK   BREAST SURGERY Left    breast biopsy   EXCISION OF BREAST BIOPSY       Outpatient Encounter Medications as of 04/25/2023  Medication Sig   acetaminophen (TYLENOL) 500 MG tablet  Take 500 mg by mouth as needed.   Ibuprofen 200 MG CAPS Take 400 mg by mouth as needed.   tamoxifen (NOLVADEX) 20 MG tablet TAKE 1 TABLET BY MOUTH DAILY.   No facility-administered encounter medications on file as of 04/25/2023.     Today's Vitals   04/25/23 1013  BP: (!) 157/87  Pulse: 78  Resp: 18  Temp: 98.4 F (36.9 C)  TempSrc: Oral  SpO2: 98%  Weight: 150 lb (68 kg)   Body mass index is 27.44 kg/m.   PHYSICAL EXAM GENERAL:alert, no distress and comfortable SKIN: no rash  EYES: sclera clear NECK: without mass LYMPH:  no palpable cervical or supraclavicular lymphadenopathy  LUNGS:  normal breathing  effort HEART:  no lower extremity edema ABDOMEN: abdomen soft, non-tender and normal bowel sounds NEURO: alert & oriented x 3 with fluent speech, no focal motor/sensory deficits Breast exam: Symmetrical without nipple discharge or inversion.  S/p left lumpectomy and radiation, incisions completely healed.  No palpable mass, nodularity, or tenderness in either breast or axilla that I could appreciate   CBC    Component Value Date/Time   WBC 4.2 04/25/2023 0947   WBC 8.2 12/28/2013 1417   RBC 4.05 04/25/2023 0947   HGB 12.6 04/25/2023 0947   HGB 14.1 07/03/2019 1220   HCT 37.3 04/25/2023 0947   HCT 41.1 07/03/2019 1220   PLT 240 04/25/2023 0947   PLT 317 07/03/2019 1220   MCV 92.1 04/25/2023 0947   MCV 94 07/03/2019 1220   MCH 31.1 04/25/2023 0947   MCHC 33.8 04/25/2023 0947   RDW 11.9 04/25/2023 0947   RDW 11.9 07/03/2019 1220   LYMPHSABS 1.1 04/25/2023 0947   LYMPHSABS 1.1 07/03/2019 1220   MONOABS 0.4 04/25/2023 0947   EOSABS 0.1 04/25/2023 0947   EOSABS 0.1 07/03/2019 1220   BASOSABS 0.0 04/25/2023 0947   BASOSABS 0.0 07/03/2019 1220     CMP     Component Value Date/Time   NA 138 10/18/2022 0811   NA 137 07/03/2019 1220   K 4.7 10/18/2022 0811   CL 103 10/18/2022 0811   CO2 27 10/18/2022 0811   GLUCOSE 97 10/18/2022 0811   BUN 13 10/18/2022 0811   BUN 6 07/03/2019 1220   CREATININE 0.90 10/18/2022 0811   CALCIUM 9.6 10/18/2022 0811   PROT 7.5 10/18/2022 0811   PROT 7.6 07/03/2019 1220   ALBUMIN 4.6 10/18/2022 0811   ALBUMIN 5.1 (H) 07/03/2019 1220   AST 13 (L) 10/18/2022 0811   ALT 8 10/18/2022 0811   ALKPHOS 51 10/18/2022 0811   BILITOT 0.6 10/18/2022 0811   GFRNONAA >60 10/18/2022 0811   GFRAA 118 07/03/2019 1220     ASSESSMENT & PLAN:Melanie Lona is a 52 y.o. female with    1. Malignant neoplasm of upper-outer quadrant of left breast, Stage IA, pT1cN0M0, ER+/PR+/HER2-, Grade II, RS 23 -Screening detected invasive ductal carcinoma diagnosed 01/01/2021   -S/p left lumpectomy with SLNB by Dr Dwain Sarna on 01/28/21, path showed 1.2cm IDC, grade 2 and intermediate grade DCIS. Margins and LN negative. RS of 23, distant recurrence risk of 9%. -S/p adjuvant radiation with Dr Roselind Messier 03/09/21-04/06/21.  -she began tamoxifen in late April/early May 2022.  -FSH from 10/18/22 showed she is still perimenopausal, but close to post menopause.   -Screening MRI 06/10/22 and mammogram 1/0/24 were both benign  -Ms. Hartis is clinically doing well.  Tolerating tamoxifen with intermittent hot flash, vaginal discomfort, and urinary frequency.  These are manageable.  Breast  exam is benign, CBC is normal.  Will follow-up the pending CMP and FSH.  Continue breast cancer surveillance, and as long as she is still perimenopausal will continue tamoxifen -next screening MRI 06/2023, and mammo 12/2023 -F/up in 6 months, or sooner if needed   2. Hot flashes, vaginal discomfort -Likely secondary to tamoxifen -declined SSRI or gabapentin in the past -stable, mild, and tolerable -She will try pelvic flood exercises   PLAN: -Recent mammogram and today's labs reviewed, f/up pending CMP and FSH -Continue breast cancer surveillance and Tamoxifen (as long as still perimenopausal) -Screening breast MRI 06/2023 -F/up in 6 months, or sooner if needed   Orders Placed This Encounter  Procedures   MR BREAST BILATERAL W WO CONTRAST INC CAD    Standing Status:   Future    Standing Expiration Date:   04/24/2024    Order Specific Question:   If indicated for the ordered procedure, I authorize the administration of contrast media per Radiology protocol    Answer:   Yes    Order Specific Question:   What is the patient's sedation requirement?    Answer:   No Sedation    Order Specific Question:   Does the patient have a pacemaker or implanted devices?    Answer:   No    Order Specific Question:   Preferred imaging location?    Answer:   [facility] (table limit - 550 lbs)       All questions were answered. The patient knows to call the clinic with any problems, questions or concerns. No barriers to learning were detected.    Santiago Glad, NP-C 04/25/2023

## 2023-04-25 ENCOUNTER — Inpatient Hospital Stay (HOSPITAL_BASED_OUTPATIENT_CLINIC_OR_DEPARTMENT_OTHER): Payer: BC Managed Care – PPO | Admitting: Nurse Practitioner

## 2023-04-25 ENCOUNTER — Inpatient Hospital Stay: Payer: BC Managed Care – PPO | Attending: Hematology

## 2023-04-25 ENCOUNTER — Encounter: Payer: Self-pay | Admitting: Nurse Practitioner

## 2023-04-25 VITALS — BP 157/87 | HR 78 | Temp 98.4°F | Resp 18 | Wt 150.0 lb

## 2023-04-25 DIAGNOSIS — Z923 Personal history of irradiation: Secondary | ICD-10-CM | POA: Insufficient documentation

## 2023-04-25 DIAGNOSIS — C50412 Malignant neoplasm of upper-outer quadrant of left female breast: Secondary | ICD-10-CM

## 2023-04-25 DIAGNOSIS — Z170 Estrogen receptor positive status [ER+]: Secondary | ICD-10-CM | POA: Diagnosis not present

## 2023-04-25 DIAGNOSIS — R350 Frequency of micturition: Secondary | ICD-10-CM | POA: Insufficient documentation

## 2023-04-25 DIAGNOSIS — R232 Flushing: Secondary | ICD-10-CM | POA: Insufficient documentation

## 2023-04-25 DIAGNOSIS — Z79810 Long term (current) use of selective estrogen receptor modulators (SERMs): Secondary | ICD-10-CM | POA: Diagnosis not present

## 2023-04-25 LAB — CBC WITH DIFFERENTIAL (CANCER CENTER ONLY)
Abs Immature Granulocytes: 0.01 10*3/uL (ref 0.00–0.07)
Basophils Absolute: 0 10*3/uL (ref 0.0–0.1)
Basophils Relative: 1 %
Eosinophils Absolute: 0.1 10*3/uL (ref 0.0–0.5)
Eosinophils Relative: 2 %
HCT: 37.3 % (ref 36.0–46.0)
Hemoglobin: 12.6 g/dL (ref 12.0–15.0)
Immature Granulocytes: 0 %
Lymphocytes Relative: 26 %
Lymphs Abs: 1.1 10*3/uL (ref 0.7–4.0)
MCH: 31.1 pg (ref 26.0–34.0)
MCHC: 33.8 g/dL (ref 30.0–36.0)
MCV: 92.1 fL (ref 80.0–100.0)
Monocytes Absolute: 0.4 10*3/uL (ref 0.1–1.0)
Monocytes Relative: 9 %
Neutro Abs: 2.6 10*3/uL (ref 1.7–7.7)
Neutrophils Relative %: 62 %
Platelet Count: 240 10*3/uL (ref 150–400)
RBC: 4.05 MIL/uL (ref 3.87–5.11)
RDW: 11.9 % (ref 11.5–15.5)
WBC Count: 4.2 10*3/uL (ref 4.0–10.5)
nRBC: 0 % (ref 0.0–0.2)

## 2023-04-25 LAB — CMP (CANCER CENTER ONLY)
ALT: 8 U/L (ref 0–44)
AST: 14 U/L — ABNORMAL LOW (ref 15–41)
Albumin: 4.8 g/dL (ref 3.5–5.0)
Alkaline Phosphatase: 54 U/L (ref 38–126)
Anion gap: 9 (ref 5–15)
BUN: 10 mg/dL (ref 6–20)
CO2: 26 mmol/L (ref 22–32)
Calcium: 9.5 mg/dL (ref 8.9–10.3)
Chloride: 101 mmol/L (ref 98–111)
Creatinine: 0.79 mg/dL (ref 0.44–1.00)
GFR, Estimated: >60 mL/min (ref >60)
Glucose, Bld: 91 mg/dL (ref 70–99)
Potassium: 3.9 mmol/L (ref 3.5–5.1)
Sodium: 136 mmol/L (ref 135–145)
Total Bilirubin: 0.6 mg/dL (ref 0.3–1.2)
Total Protein: 7.5 g/dL (ref 6.5–8.1)

## 2023-04-27 LAB — FOLLICLE STIMULATING HORMONE: FSH: 17.7 m[IU]/mL

## 2023-07-01 ENCOUNTER — Other Ambulatory Visit: Payer: Self-pay | Admitting: Nurse Practitioner

## 2023-07-01 NOTE — Telephone Encounter (Signed)
Per last OV in April 2024, -Continue breast cancer surveillance and Tamoxifen (as long as still perimenopausal)

## 2023-07-06 ENCOUNTER — Ambulatory Visit (HOSPITAL_COMMUNITY)
Admission: RE | Admit: 2023-07-06 | Discharge: 2023-07-06 | Disposition: A | Discharge status: Home / Self Care | Payer: BC Managed Care – PPO | Source: Ambulatory Visit | Attending: Nurse Practitioner | Admitting: Nurse Practitioner

## 2023-07-06 DIAGNOSIS — C50412 Malignant neoplasm of upper-outer quadrant of left female breast: Secondary | ICD-10-CM | POA: Diagnosis present

## 2023-07-06 DIAGNOSIS — Z170 Estrogen receptor positive status [ER+]: Secondary | ICD-10-CM | POA: Diagnosis present

## 2023-07-06 MED ORDER — GADOBUTROL 1 MMOL/ML IV SOLN
7.0000 mL | Freq: Once | INTRAVENOUS | Status: AC | PRN
  Administered 2023-07-06: 7 mL via INTRAVENOUS

## 2023-09-05 ENCOUNTER — Ambulatory Visit: Payer: BC Managed Care – PPO | Attending: General Surgery

## 2023-09-05 VITALS — Wt 160.4 lb

## 2023-09-05 DIAGNOSIS — Z483 Aftercare following surgery for neoplasm: Secondary | ICD-10-CM
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
## 2023-10-21 ENCOUNTER — Other Ambulatory Visit: Payer: Self-pay

## 2023-10-21 DIAGNOSIS — Z170 Estrogen receptor positive status [ER+]: Secondary | ICD-10-CM

## 2023-10-22 NOTE — Progress Notes (Unsigned)
Patient Care Team: Mayer Masker, PA-C (Inactive) as PCP - [facility], [facility] Kathryne Hitch, MD as Consulting Physician (Orthopedic Surgery) Pershing Proud, RN as Oncology Nurse Navigator Donnelly Angelica, RN as Oncology Nurse Navigator Emelia Loron, MD as Consulting Physician (General Surgery) Malachy Mood, MD as Consulting Physician (Hematology) Pollyann Samples, NP as Nurse Practitioner (Nurse Practitioner) Antony Blackbird, MD as Consulting Physician (Radiation Oncology)   CHIEF COMPLAINT: Follow up left breast cancer   Oncology History Overview Note  Cancer Staging Malignant neoplasm of upper-outer quadrant of left breast in female, estrogen receptor positive (HCC) Staging form: Breast, AJCC 8th Edition - Clinical stage from 01/01/2021: Stage IA (cT1c, cN0, cM0, G2, ER+, PR+, HER2-) - Signed by Malachy Mood, MD on 01/20/2021 Stage prefix: Initial diagnosis    Malignant neoplasm of upper-outer quadrant of left breast in female, estrogen receptor positive (HCC)  01/01/2021 Mammogram   Mammogram 01/01/21  IMPRESSION: 1. 11x7x8 mm mass in the 1 o'clock position of the left breast 8cmfn, highly suspicious for breast malignancy. No left axillary lymphadenopathy.   01/01/2021 Initial Biopsy   Diagnosis 01/01/21 Breast, left, needle core biopsy, 1 o'clock, upper outer quadrant, ribbon clip - INVASIVE MAMMARY CARCINOMA, GRADE II. - SEE MICROSCOPIC DESCRIPTION. Microscopic Comment The greatest tumor length is 0.9 cm. E-cadherin and breast prognostic profile will be performed. 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    01/01/2021 Receptors her2   PROGNOSTIC INDICATORS Results: IMMUNOHISTOCHEMICAL AND MORPHOMETRIC ANALYSIS PERFORMED MANUALLY The tumor cells are EQUIVOCAL for Her2 (2+). HER2 by FISH will be performed and the results reported separately Estrogen Receptor: 95%, POSITIVE, STRONG  STAINING INTENSITY Progesterone Receptor: 50%, POSITIVE, MODERATE STAINING INTENSITY Proliferation Marker Ki67: 20% FLUORESCENCE IN-SITU HYBRIDIZATION Results: GROUP 5: HER2 **NEGATIVE** Equivocal form of amplification of the HER2 gene was detected in the IHC 2+ tissue sample received from this individual. HER2 FISH was performed by a technologist and cell imaging and analysis on the BioView.   01/01/2021 Cancer Staging   Staging form: Breast, AJCC 8th Edition - Clinical stage from 01/01/2021: Stage IA (cT1c, cN0, cM0, G2, ER+, PR+, HER2-) - Signed by Malachy Mood, MD on 01/20/2021   01/16/2021 Initial Diagnosis   Malignant neoplasm of upper-outer quadrant of left breast in female, estrogen receptor positive (HCC)   01/28/2021 Surgery   LEFT BREAST LUMPECTOMY WITH RADIOACTIVE SEED AND LEFT AXILLARY SENTINEL LYMPH NODE BIOPSY by Dr Dwain Sarna    01/28/2021 Pathology Results   FINAL MICROSCOPIC DIAGNOSIS:   A. BREAST, LEFT W/SEED, LUMPECTOMY:  -  Invasive ductal carcinoma, Nottingham grade 2 of 3, 1.2 cm  -  Ductal carcinoma in-situ, intermediate grade  -  Calcifications associated with carcinoma  -  Margins uninvolved by carcinoma (see part B-E for final margin  status)  -  Previous biopsy site changes present  -  See oncology table below   B. BREAST, LEFT ADDITIONAL MEDIAL MARGIN, EXCISION:  -  No residual carcinoma identified   C. BREAST, LEFT ADDITIONAL POSTERIOR MARGIN, EXCISION:  -  No residual carcinoma identified   D. BREAST, LEFT ADDITIONAL LATERAL MARGIN, EXCISION:  -  No residual carcinoma   E. BREAST, LEFT ADDITIONAL SUPERIOR MARGIN, EXCISION:  -  No residual carcinoma identified  -  See comment   F. SENTINEL LYMPH NODE, LEFT AXILLARY, BIOPSY:  -  No carcinoma identified in one lymph node (0/1)  -  See comment   G. SENTINEL LYMPH NODE, LEFT AXILLARY, BIOPSY:  -  No carcinoma identified in one lymph node (0/1)  -  See comment   H. SENTINEL LYMPH NODE, LEFT AXILLARY,  BIOPSY:  -  No carcinoma identified in one lymph node (0/1)  -  See comment   COMMENT:   E.  There is cauterized epithelium which by immunohistochemistry  maintains a myoepithelial layer (positive for SMM and calponin).  Cytokeratin AE1/3 does not highlight an infiltrative epithelioid  component.   F-H.  Given the lobular-like morphology, cytokeratin AE1/3 was performed  on the lymph nodes to exclude isolated tumor cells in micrometastasis.  Cytokeratin AE1/3 is negative.   01/28/2021 Oncotype testing   Recurrence score 23 with distant recurrence risk of 9 years with AI/Tamoxifen at 9% She has less than 1% benefit of chemotherapy.    01/28/2021 Cancer Staging   Staging form: Breast, AJCC 8th Edition - Pathologic stage from 01/28/2021: Stage IA (pT1c, pN0, cM0, G2, ER+, PR+, HER2-, Oncotype DX score: 23) - Signed by Malachy Mood, MD on 04/03/2021 Stage prefix: Initial diagnosis Multigene prognostic tests performed: Oncotype DX Recurrence score range: Greater than or equal to 11 Histologic grading system: 3 grade system Residual tumor (R): R0 - None   03/09/2021 - 04/06/2021 Radiation Therapy   Adjuvant radiation with Dr Roselind Messier    03/2021 -  Anti-estrogen oral therapy   Tamoxifen 20mg  once daily starting in late April or Early May 2022.    07/02/2021 Survivorship   SCP delivered by Santiago Glad, NP       CURRENT THERAPY: Tamoxifen 20 mg daily, starting 04/2021  INTERVAL HISTORY Cindy [name] returns for follow up as scheduled. Last seen by me 04/25/23.  Doing well overall without significant changes in her health.  Continues tamoxifen, tolerating well with stable dry mouth and periodic hot flashes that seem to come in waves every few months.  She has been feeling rundown lately, attributes this to just a lot going on in life.  Denies concerns in her breast such as new lump/mass, nipple discharge or inversion, skin change, bone/joint pain, recent infection, or any other new or specific  complaints.  ROS  All other systems reviewed and negative   Past Medical History:  Diagnosis Date   Anxiety    Breast cancer (HCC) 12/2020   left- + radiation, no chemotherapy   Elevated blood pressure reading    only in Doctor's office, no meds , no HTN   GERD (gastroesophageal reflux disease)    OCC ? from Tamoxifen   History of radiation therapy 03/09/2021-04/06/2021   left breast    Dr Antony Blackbird   Medical history non-contributory    Personal history of radiation therapy    PONV (postoperative nausea and vomiting)      Past Surgical History:  Procedure Laterality Date   BREAST LUMPECTOMY     BREAST LUMPECTOMY WITH RADIOACTIVE SEED AND SENTINEL LYMPH NODE BIOPSY Left 01/28/2021   Procedure: LEFT BREAST LUMPECTOMY WITH RADIOACTIVE SEED AND LEFT AXILLARY SENTINEL LYMPH NODE BIOPSY;  Surgeon: Emelia Loron, MD;  Location: [facility];  Service: General;  Laterality: Left;  PEC BLOCK   BREAST SURGERY Left    breast biopsy   EXCISION OF BREAST BIOPSY       Outpatient Encounter Medications as of 10/25/2023  Medication Sig   acetaminophen (TYLENOL) 500 MG tablet Take 500 mg by mouth as needed.   Ibuprofen 200 MG CAPS Take 400 mg by mouth as needed.   [DISCONTINUED] tamoxifen (NOLVADEX) 20 MG tablet TAKE 1 TABLET  BY MOUTH DAILY.   tamoxifen (NOLVADEX) 20 MG tablet Take 1 tablet (20 mg total) by mouth daily.   No facility-administered encounter medications on file as of 10/25/2023.     Today's Vitals   10/25/23 1011 10/25/23 1012  BP: (!) 136/98   Pulse: 83   Resp: 20   Temp: 98.3 F (36.8 C)   TempSrc: Oral   SpO2: 99%   Weight: 159 lb 1.6 oz (72.2 kg)   PainSc:  0-No pain   Body mass index is 29.1 kg/m.   PHYSICAL EXAM GENERAL:alert, no distress and comfortable SKIN: no rash  EYES: sclera clear NECK: without mass LYMPH:  no palpable cervical or supraclavicular lymphadenopathy  LUNGS: normal breathing effort HEART: no lower extremity  edema ABDOMEN: abdomen soft, non-tender and normal bowel sounds NEURO: alert & oriented x 3 with fluent speech, no focal motor/sensory deficits MSK: no spinal ttp Breast exam: No nipple discharge or inversion.  S/p left lumpectomy, incisions completely healed.  No palpable mass or nodularity in either breast or axilla that I could appreciate   CBC    Component Value Date/Time   WBC 4.0 10/25/2023 0937   WBC 8.2 12/28/2013 1417   RBC 4.27 10/25/2023 0937   HGB 13.1 10/25/2023 0937   HGB 14.1 07/03/2019 1220   HCT 38.9 10/25/2023 0937   HCT 41.1 07/03/2019 1220   PLT 258 10/25/2023 0937   PLT 317 07/03/2019 1220   MCV 91.1 10/25/2023 0937   MCV 94 07/03/2019 1220   MCH 30.7 10/25/2023 0937   MCHC 33.7 10/25/2023 0937   RDW 11.9 10/25/2023 0937   RDW 11.9 07/03/2019 1220   LYMPHSABS 1.1 10/25/2023 0937   LYMPHSABS 1.1 07/03/2019 1220   MONOABS 0.3 10/25/2023 0937   EOSABS 0.1 10/25/2023 0937   EOSABS 0.1 07/03/2019 1220   BASOSABS 0.1 10/25/2023 0937   BASOSABS 0.0 07/03/2019 1220     CMP     Component Value Date/Time   NA 138 10/25/2023 0937   NA 137 07/03/2019 1220   K 4.0 10/25/2023 0937   CL 102 10/25/2023 0937   CO2 27 10/25/2023 0937   GLUCOSE 105 (H) 10/25/2023 0937   BUN 8 10/25/2023 0937   BUN 6 07/03/2019 1220   CREATININE 0.85 10/25/2023 0937   CALCIUM 9.6 10/25/2023 0937   PROT 7.7 10/25/2023 0937   PROT 7.6 07/03/2019 1220   ALBUMIN 4.7 10/25/2023 0937   ALBUMIN 5.1 (H) 07/03/2019 1220   AST 13 (L) 10/25/2023 0937   ALT 7 10/25/2023 0937   ALKPHOS 54 10/25/2023 0937   BILITOT 0.8 10/25/2023 0937   GFRNONAA >60 10/25/2023 0937   GFRAA 118 07/03/2019 1220     ASSESSMENT & PLAN:Cindy [name] is a 52 y.o. female with    1. Malignant neoplasm of upper-outer quadrant of left breast, Stage IA, pT1cN0M0, ER+/PR+/HER2-, Grade II, RS 23 -Screening detected invasive ductal carcinoma diagnosed 01/01/2021  -S/p left lumpectomy with SLNB by Dr Dwain Sarna on  01/28/21, path showed 1.2cm IDC, grade 2 and intermediate grade DCIS. Margins and LN negative. RS of 23, distant recurrence risk of 9%. -S/p adjuvant radiation with Dr Roselind Messier 03/09/21-04/06/21.  -she began tamoxifen in late April/early May 2022.  -FSH from 10/18/22 showed she is still perimenopausal, but close to post menopause.   -Screening mammogram 1/0/24 and MRI 07/06/23 were both benign  -03/2023: FSH perimenopausal (17), she continues tamoxifen -Cindy [name] is clinically doing well, tolerating tamoxifen.  Exam is benign, labs are unremarkable.  Overall no clinical concern for recurrence -She has felt rundown, CBC/CMP are normal. I reviewed rest, hydration, exercise etc. She can f/up with PCP for TSH and further eval -Continue breast cancer surveillance and tamoxifen -Next mammo 12/2023, and screening MRI 06/2024 -Follow-up in 6 months, or sooner if needed   2. Hot flashes, vaginal discomfort -Likely secondary to tamoxifen -declined SSRI or gabapentin in the past -stable, mild, and tolerable. She has noticed more issues with BMs, having to change position to complete a BM. Denies bleeding, pain, or hard stools -I encouraged her to try Kegel exercises, she will try, and let me know if she'd like to try pelvic flood PT       PLAN: -Labs reviewed -Continue breast cancer surveillance and tamoxifen, refilled  -Next mammo 12/2023, and screening breast MRI 06/2024 -Follow-up in 6 months, or sooner if needed  Orders Placed This Encounter  Procedures   MM DIAG BREAST TOMO BILATERAL    Standing Status:   Future    Standing Expiration Date:   10/24/2024    Order Specific Question:   Reason for Exam (SYMPTOM  OR DIAGNOSIS REQUIRED)    Answer:   h/o left breast cancer 2022    Order Specific Question:   Is the patient pregnant?    Answer:   No    Order Specific Question:   Preferred imaging location?    Answer:   [facility]      All questions were answered. The patient knows to call the  clinic with any problems, questions or concerns. No barriers to learning were detected.   Santiago Glad, NP-C 10/25/2023

## 2023-10-25 ENCOUNTER — Encounter: Payer: Self-pay | Admitting: Nurse Practitioner

## 2023-10-25 ENCOUNTER — Inpatient Hospital Stay: Payer: BC Managed Care – PPO | Attending: Nurse Practitioner

## 2023-10-25 ENCOUNTER — Inpatient Hospital Stay (HOSPITAL_BASED_OUTPATIENT_CLINIC_OR_DEPARTMENT_OTHER): Payer: BC Managed Care – PPO | Admitting: Nurse Practitioner

## 2023-10-25 VITALS — BP 136/98 | HR 83 | Temp 98.3°F | Resp 20 | Wt 159.1 lb

## 2023-10-25 DIAGNOSIS — Z170 Estrogen receptor positive status [ER+]: Secondary | ICD-10-CM

## 2023-10-25 DIAGNOSIS — R232 Flushing: Secondary | ICD-10-CM | POA: Diagnosis not present

## 2023-10-25 DIAGNOSIS — Z923 Personal history of irradiation: Secondary | ICD-10-CM | POA: Diagnosis not present

## 2023-10-25 DIAGNOSIS — Z79810 Long term (current) use of selective estrogen receptor modulators (SERMs): Secondary | ICD-10-CM | POA: Insufficient documentation

## 2023-10-25 DIAGNOSIS — C50412 Malignant neoplasm of upper-outer quadrant of left female breast: Secondary | ICD-10-CM | POA: Insufficient documentation

## 2023-10-25 DIAGNOSIS — K219 Gastro-esophageal reflux disease without esophagitis: Secondary | ICD-10-CM | POA: Insufficient documentation

## 2023-10-25 LAB — CBC WITH DIFFERENTIAL (CANCER CENTER ONLY)
Abs Immature Granulocytes: 0 10*3/uL (ref 0.00–0.07)
Basophils Absolute: 0.1 10*3/uL (ref 0.0–0.1)
Basophils Relative: 2 %
Eosinophils Absolute: 0.1 10*3/uL (ref 0.0–0.5)
Eosinophils Relative: 2 %
HCT: 38.9 % (ref 36.0–46.0)
Hemoglobin: 13.1 g/dL (ref 12.0–15.0)
Immature Granulocytes: 0 %
Lymphocytes Relative: 27 %
Lymphs Abs: 1.1 10*3/uL (ref 0.7–4.0)
MCH: 30.7 pg (ref 26.0–34.0)
MCHC: 33.7 g/dL (ref 30.0–36.0)
MCV: 91.1 fL (ref 80.0–100.0)
Monocytes Absolute: 0.3 10*3/uL (ref 0.1–1.0)
Monocytes Relative: 7 %
Neutro Abs: 2.5 10*3/uL (ref 1.7–7.7)
Neutrophils Relative %: 62 %
Platelet Count: 258 10*3/uL (ref 150–400)
RBC: 4.27 MIL/uL (ref 3.87–5.11)
RDW: 11.9 % (ref 11.5–15.5)
WBC Count: 4 10*3/uL (ref 4.0–10.5)
nRBC: 0 % (ref 0.0–0.2)

## 2023-10-25 LAB — CMP (CANCER CENTER ONLY)
ALT: 7 U/L (ref 0–44)
AST: 13 U/L — ABNORMAL LOW (ref 15–41)
Albumin: 4.7 g/dL (ref 3.5–5.0)
Alkaline Phosphatase: 54 U/L (ref 38–126)
Anion gap: 9 (ref 5–15)
BUN: 8 mg/dL (ref 6–20)
CO2: 27 mmol/L (ref 22–32)
Calcium: 9.6 mg/dL (ref 8.9–10.3)
Chloride: 102 mmol/L (ref 98–111)
Creatinine: 0.85 mg/dL (ref 0.44–1.00)
GFR, Estimated: >60 mL/min (ref >60)
Glucose, Bld: 105 mg/dL — ABNORMAL HIGH (ref 70–99)
Potassium: 4 mmol/L (ref 3.5–5.1)
Sodium: 138 mmol/L (ref 135–145)
Total Bilirubin: 0.8 mg/dL (ref 0.3–1.2)
Total Protein: 7.7 g/dL (ref 6.5–8.1)

## 2023-10-25 MED ORDER — TAMOXIFEN CITRATE 20 MG PO TABS: 20.0000 mg | ORAL_TABLET | Freq: Every day | ORAL | 3 refills | Status: DC

## 2024-01-09 ENCOUNTER — Ambulatory Visit
Admission: RE | Admit: 2024-01-09 | Discharge: 2024-01-09 | Disposition: A | Discharge status: Home / Self Care | Payer: BC Managed Care – PPO | Source: Ambulatory Visit | Attending: Nurse Practitioner | Admitting: Nurse Practitioner

## 2024-01-09 DIAGNOSIS — C50412 Malignant neoplasm of upper-outer quadrant of left female breast: Secondary | ICD-10-CM

## 2024-03-05 ENCOUNTER — Ambulatory Visit: Payer: BC Managed Care – PPO | Attending: General Surgery

## 2024-03-05 VITALS — Wt 163.0 lb

## 2024-03-05 DIAGNOSIS — Z483 Aftercare following surgery for neoplasm: Secondary | ICD-10-CM | POA: Insufficient documentation
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
## 2024-03-16 ENCOUNTER — Ambulatory Visit
Admission: EM | Admit: 2024-03-16 | Discharge: 2024-03-16 | Disposition: A | Discharge status: Home / Self Care | Attending: Internal Medicine | Admitting: Internal Medicine

## 2024-03-16 ENCOUNTER — Encounter: Payer: Self-pay | Admitting: Emergency Medicine

## 2024-03-16 DIAGNOSIS — H6123 Impacted cerumen, bilateral: Secondary | ICD-10-CM | POA: Diagnosis not present

## 2024-03-16 DIAGNOSIS — R030 Elevated blood-pressure reading, without diagnosis of hypertension: Secondary | ICD-10-CM

## 2024-03-16 NOTE — ED Provider Notes (Signed)
 [facility]    CSN: [id number] Arrival date & time: 03/16/24  0809      History   Chief Complaint Chief Complaint  Patient presents with   Cerumen Impaction    HPI Cindy [name] is a 53 y.o. female.   Patient presents today for potential impacted cerumen of ears as she reports that it feels like her ears are "clogged".  Reports right ear has been minimally painful.  Reports that she has a history of impacted cerumen.  Blood pressure is also elevated.  Reports that she has been told by her PCP to monitor her blood pressure at home but she does not.  She does not take any blood pressure medication.  Patient is not reporting any chest pain, dizziness, headache, shortness of breath, nausea, vomiting, blurred vision.     Past Medical History:  Diagnosis Date   Anxiety    Breast cancer (HCC) 12/2020   left- + radiation, no chemotherapy   Elevated blood pressure reading    only in Doctor's office, no meds , no HTN   GERD (gastroesophageal reflux disease)    OCC ? from Tamoxifen   History of radiation therapy 03/09/2021-04/06/2021   left breast    Dr Antony Blackbird   Medical history non-contributory    Personal history of radiation therapy    PONV (postoperative nausea and vomiting)     Patient Active Problem List   Diagnosis Date Noted   Upper respiratory tract infection due to COVID-19 virus 12/29/2021   Pain of breast 10/12/2021   Malignant tumor of breast (HCC) 07/03/2021   Malignant neoplasm of upper-outer quadrant of left breast in female, estrogen receptor positive (HCC) 01/16/2021   Hallux valgus (acquired), right foot 03/06/2020   Olecranon bursitis, left elbow 03/06/2020   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   Bilateral hand pain 11/26/2019   Bilateral shoulder pain 11/26/2019   Anxiety 03/16/2018   Healthcare maintenance 03/16/2018   Foot mass, right 02/22/2018   Pain in right ankle and joints of right foot 02/22/2018    Past Surgical History:   Procedure Laterality Date   BREAST LUMPECTOMY     BREAST LUMPECTOMY WITH RADIOACTIVE SEED AND SENTINEL LYMPH NODE BIOPSY Left 01/28/2021   Procedure: LEFT BREAST LUMPECTOMY WITH RADIOACTIVE SEED AND LEFT AXILLARY SENTINEL LYMPH NODE BIOPSY;  Surgeon: Emelia Loron, MD;  Location: [facility];  Service: General;  Laterality: Left;  PEC BLOCK   BREAST SURGERY Left    breast biopsy   EXCISION OF BREAST BIOPSY      OB History     Gravida  2   Para  1   Term  1   Preterm      AB      Living  1      SAB      IAB      Ectopic      Multiple      Live Births               Home Medications    Prior to Admission medications   Medication Sig Start Date End Date Taking? Authorizing Provider  acetaminophen (TYLENOL) 500 MG tablet Take 500 mg by mouth as needed.    [provider]  Ibuprofen 200 MG CAPS Take 400 mg by mouth as needed.    [provider]  tamoxifen (NOLVADEX) 20 MG tablet Take 1 tablet (20 mg total) by mouth daily. 10/25/23   Pollyann Samples, NP  Family History Family History  Problem Relation Age of Onset   Cancer Father        brain   Diabetes Brother    Cancer Maternal Grandmother        lung   Cancer Maternal Grandfather        brain and bone   Cancer Paternal Grandfather        brain   Colon cancer Neg Hx    Colon polyps Neg Hx    Esophageal cancer Neg Hx    Stomach cancer Neg Hx    Rectal cancer Neg Hx     Social History Social History   Tobacco Use   Smoking status: Never   Smokeless tobacco: Never  Vaping Use   Vaping status: Never Used  Substance Use Topics   Alcohol use: Not Currently    Alcohol/week: 6.0 standard drinks of alcohol    Types: 6 Cans of beer per week    Comment: few times a week, stopped in 2020   Drug use: No     Allergies   Latex   Review of Systems Review of Systems Per HPI  Physical Exam Triage Vital Signs ED Triage Vitals  Encounter Vitals Group      BP 03/16/24 0852 (!) 171/105     Systolic BP Percentile --      Diastolic BP Percentile --      Pulse Rate 03/16/24 0852 94     Resp 03/16/24 0852 20     Temp 03/16/24 0852 98.5 F (36.9 C)     Temp Source 03/16/24 0852 Oral     SpO2 03/16/24 0852 96 %     Weight --      Height --      Head Circumference --      Peak Flow --      Pain Score 03/16/24 0849 3     Pain Loc --      Pain Education --      Exclude from Growth Chart --    No data found.  Updated Vital Signs BP (!) 171/105 (BP Location: Right Arm)   Pulse 94   Temp 98.5 F (36.9 C) (Oral)   Resp 20   LMP  (LMP Unknown) Comment: last period 1 yr 10-2021  SpO2 96%   Visual Acuity Right Eye Distance:   Left Eye Distance:   Bilateral Distance:    Right Eye Near:   Left Eye Near:    Bilateral Near:     Physical Exam Constitutional:      General: She is not in acute distress.    Appearance: Normal appearance. She is not toxic-appearing or diaphoretic.  HENT:     Head: Normocephalic and atraumatic.     Ears:     Comments: Patient had impacted cerumen bilaterally on original physical exam.  On second physical exam, ears were irrigated successfully.  TMs are normal. Eyes:     Extraocular Movements: Extraocular movements intact.     Conjunctiva/sclera: Conjunctivae normal.  Pulmonary:     Effort: Pulmonary effort is normal.  Neurological:     General: No focal deficit present.     Mental Status: She is alert and oriented to person, place, and time. Mental status is at baseline.     Cranial Nerves: Cranial nerves 2-12 are intact.     Sensory: Sensation is intact.     Motor: Motor function is intact.     Coordination: Coordination is intact.  Gait: Gait is intact.  Psychiatric:        Mood and Affect: Mood normal.        Behavior: Behavior normal.        Thought Content: Thought content normal.        Judgment: Judgment normal.      UC Treatments / Results  Labs (all labs ordered are listed, but  only abnormal results are displayed) Labs Reviewed - No data to display  EKG   Radiology No results found.  Procedures Procedures (including critical care time)  Medications Ordered in UC Medications - No data to display  Initial Impression / Assessment and Plan / UC Course  I have reviewed the triage vital signs and the nursing notes.  Pertinent labs & imaging results that were available during my care of the patient were reviewed by me and considered in my medical decision making (see chart for details).     Patient had impacted cerumen bilaterally.  Ears were irrigated successfully with no other complications noted on physical exam after irrigation.  Blood pressure slightly elevated with recheck being improved.  Advised patient to monitor closely at home and follow-up with urgent care or PCP if it remains elevated.  No signs of hypertensive urgency on exam.  Advised strict follow-up precautions.  Patient verbalized understanding and was agreeable with plan. Final Clinical Impressions(s) / UC Diagnoses   Final diagnoses:  Bilateral impacted cerumen  Elevated blood pressure reading     Discharge Instructions      Your ears have been cleaned out.  Monitor blood pressure at home as we discussed.  Follow-up with PCP.    ED Prescriptions   None    PDMP not reviewed this encounter.   Gustavus Bryant, [place] 03/16/24 1034

## 2024-03-16 NOTE — ED Triage Notes (Signed)
 Pt presents with wax build up in both ears. Pt states the left ear was blocked for 4 days and the right was blocked since this morning.

## 2024-03-16 NOTE — Discharge Instructions (Signed)
 Your ears have been cleaned out.  Monitor blood pressure at home as we discussed.  Follow-up with PCP.

## 2024-04-24 ENCOUNTER — Other Ambulatory Visit: Payer: Self-pay | Admitting: Nurse Practitioner

## 2024-04-24 DIAGNOSIS — Z170 Estrogen receptor positive status [ER+]: Secondary | ICD-10-CM

## 2024-04-24 NOTE — Assessment & Plan Note (Addendum)
 Stage IA, pT1cN0M0, ER+/PR+/HER2-, Grade II, RS 23 -Screening detected invasive ductal carcinoma diagnosed 01/01/2021  -S/p left lumpectomy with SLNB by Dr Delane Fear on 01/28/21, path showed 1.2cm IDC, grade 2 and intermediate grade DCIS. Margins and LN negative. RS of 23, distant recurrence risk of 9%. -S/p adjuvant radiation with Dr Eloise Hake 03/09/21-04/06/21.  -she began tamoxifen  in late April/early May 2022.  -FSH from 10/18/22 showed she is still perimenopausal, but close to post menopause.   -Screening mammogram 1/0/24 and MRI 07/06/23 were both benign  -03/2023: FSH perimenopausal (17), she continues tamoxifen  04/25/2024  -reviewed 3D screening mammogram done 01/09/2024 with benign results.  MRI of bilateral breast should be done in July 2025.  Ordered during today's visit. -Continue breast cancer surveillance and tamoxifen  -Follow-up in 6 months, or sooner if needed

## 2024-04-24 NOTE — Progress Notes (Signed)
 Patient Care Team: Abonza, Maritza, PA-C (Inactive) as PCP - General (Physician Assistant) Associates, [facility] Arnie Lao, MD as Consulting Physician (Orthopedic Surgery) Auther Bo, RN as Oncology Nurse Navigator Alane Hsu, RN as Oncology Nurse Navigator Enid Harry, MD as Consulting Physician (General Surgery) Sonja Suffolk, MD as Consulting Physician (Hematology) Burton, Lacie K, NP as Nurse Practitioner (Nurse Practitioner) Retta Caster, MD as Consulting Physician (Radiation Oncology)  Clinic Day:  04/27/2024  Referring physician: No ref. provider found  ASSESSMENT & PLAN:   Assessment & Plan: Malignant neoplasm of upper-outer quadrant of left breast in female, estrogen receptor positive (HCC) Stage IA, pT1cN0M0, ER+/PR+/HER2-, Grade II, RS 23 -Screening detected invasive ductal carcinoma diagnosed 01/01/2021  -S/p left lumpectomy with SLNB by Dr Delane Fear on 01/28/21, path showed 1.2cm IDC, grade 2 and intermediate grade DCIS. Margins and LN negative. RS of 23, distant recurrence risk of 9%. -S/p adjuvant radiation with Dr Eloise Hake 03/09/21-04/06/21.  -she began tamoxifen  in late April/early May 2022.  -FSH from 10/18/22 showed she is still perimenopausal, but close to post menopause.   -Screening mammogram 1/0/24 and MRI 07/06/23 were both benign  -03/2023: FSH perimenopausal (17), she continues tamoxifen  04/25/2024  -reviewed 3D screening mammogram done 01/09/2024 with benign results.  MRI of bilateral breast should be done in July 2025.  Ordered during today's visit. -Continue breast cancer surveillance and tamoxifen  -Follow-up in 6 months, or sooner if needed   Plan: Labs reviewed.  All stable and unremarkable. Reviewed 3D mammogram results from 01/09/2024.  They were benign.  Recall in 1 year. Bilateral breast MRI ordered for July 2025. Continue tamoxifen  20mg  daily. Labs and follow-up in 6 months, sooner if needed.  The patient understands  the plans discussed today and is in agreement with them.  She knows to contact our office if she develops concerns prior to her next appointment.  I provided 25 minutes of face-to-face time during this encounter and > 50% was spent counseling as documented under my assessment and plan.    Cindy Deis, NP  [facility] - A DEPT OF [facility] [address] [place] [address] Dept: [phone] Dept Fax: [phone]   Orders Placed This Encounter  Procedures   MR BREAST BILATERAL W WO CONTRAST INC CAD    Standing Status:   Future    Expected Date:   07/04/2024    Expiration Date:   04/25/2025    If indicated for the ordered procedure, I authorize the administration of contrast media per Radiology protocol:   Yes    What is the patient's sedation requirement?:   No Sedation    Does the patient have a pacemaker or implanted devices?:   No    Preferred imaging location?:   [facility] (table limit - 550 lbs)      CHIEF COMPLAINT:  CC: Left breast cancer, estrogen receptor positive  Current Treatment: Tamoxifen  20 mg daily starting May 2022  INTERVAL HISTORY:  Cindy [name] is here today for repeat clinical assessment.  She was last seen by Thor Fling, NP on 10/25/2023.  She had 3D screening mammogram on 01/09/2024 with benign results.  She is due for breast MRI in July 2025.  This was ordered during today's visit. She reports to doing well overall. Believes that tamoxifen  gives her dry mouth. Has intermittent hot flashes, but nothing unmanageable. She denies chest pain, chest pressure, or shortness of breath. She denies  headaches or visual disturbances. She denies abdominal pain, nausea, vomiting, or changes in bowel or bladder habits.  She denies fevers or chills. She denies pain. Her appetite is good. Her weight has been stable.  I have reviewed the past medical history, past surgical history, social history and family  history with the patient and they are unchanged from previous note.  ALLERGIES:  is allergic to latex.  MEDICATIONS:  Current Outpatient Medications  Medication Sig Dispense Refill   acetaminophen  (TYLENOL ) 500 MG tablet Take 500 mg by mouth as needed.     Ibuprofen 200 MG CAPS Take 400 mg by mouth as needed.     tamoxifen  (NOLVADEX ) 20 MG tablet Take 1 tablet (20 mg total) by mouth daily. 90 tablet 3   No current facility-administered medications for this visit.    HISTORY OF PRESENT ILLNESS:   Oncology History Overview Note  Cancer Staging Malignant neoplasm of upper-outer quadrant of left breast in female, estrogen receptor positive (HCC) Staging form: Breast, AJCC 8th Edition - Clinical stage from 01/01/2021: Stage IA (cT1c, cN0, cM0, G2, ER+, PR+, HER2-) - Signed by Sonja [place], MD on 01/20/2021 Stage prefix: Initial diagnosis    Malignant neoplasm of upper-outer quadrant of left breast in female, estrogen receptor positive (HCC)  01/01/2021 Mammogram   Mammogram 01/01/21  IMPRESSION: 1. 11x7x8 mm mass in the 1 o'clock position of the left breast 8cmfn, highly suspicious for breast malignancy. No left axillary lymphadenopathy.   01/01/2021 Initial Biopsy   Diagnosis 01/01/21 Breast, left, needle core biopsy, 1 o'clock, upper outer quadrant, ribbon clip - INVASIVE MAMMARY CARCINOMA, GRADE II. - SEE MICROSCOPIC DESCRIPTION. Microscopic Comment The greatest tumor length is 0.9 cm. E-cadherin and breast prognostic profile will be performed. 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    01/01/2021 Receptors her2   PROGNOSTIC INDICATORS Results: IMMUNOHISTOCHEMICAL AND MORPHOMETRIC ANALYSIS PERFORMED MANUALLY The tumor cells are EQUIVOCAL for Her2 (2+). HER2 by FISH will be performed and the results reported separately Estrogen Receptor: 95%, POSITIVE, STRONG STAINING INTENSITY Progesterone Receptor: 50%, POSITIVE, MODERATE STAINING  INTENSITY Proliferation Marker Ki67: 20% FLUORESCENCE IN-SITU HYBRIDIZATION Results: GROUP 5: HER2 **NEGATIVE** Equivocal form of amplification of the HER2 gene was detected in the IHC 2+ tissue sample received from this individual. HER2 FISH was performed by a technologist and cell imaging and analysis on the Bio[employer].   01/01/2021 Cancer Staging   Staging form: Breast, AJCC 8th Edition - Clinical stage from 01/01/2021: Stage IA (cT1c, cN0, cM0, G2, ER+, PR+, HER2-) - Signed by Sonja Ionia, MD on 01/20/2021   01/16/2021 Initial Diagnosis   Malignant neoplasm of upper-outer quadrant of left breast in female, estrogen receptor positive (HCC)   01/28/2021 Surgery   LEFT BREAST LUMPECTOMY WITH RADIOACTIVE SEED AND LEFT AXILLARY SENTINEL LYMPH NODE BIOPSY by Dr Delane Fear    01/28/2021 Pathology Results   FINAL MICROSCOPIC DIAGNOSIS:   A. BREAST, LEFT W/SEED, LUMPECTOMY:  -  Invasive ductal carcinoma, Nottingham grade 2 of 3, 1.2 cm  -  Ductal carcinoma in-situ, intermediate grade  -  Calcifications associated with carcinoma  -  Margins uninvolved by carcinoma (see part B-E for final margin  status)  -  Previous biopsy site changes present  -  See oncology table below   B. BREAST, LEFT ADDITIONAL MEDIAL MARGIN, EXCISION:  -  No residual carcinoma identified   C. BREAST, LEFT ADDITIONAL POSTERIOR MARGIN, EXCISION:  -  No residual carcinoma identified   D. BREAST, LEFT ADDITIONAL LATERAL MARGIN, EXCISION:  -  No residual carcinoma   E. BREAST, LEFT ADDITIONAL SUPERIOR MARGIN, EXCISION:  -  No residual carcinoma identified  -  See comment   F. SENTINEL LYMPH NODE, LEFT AXILLARY, BIOPSY:  -  No carcinoma identified in one lymph node (0/1)  -  See comment   G. SENTINEL LYMPH NODE, LEFT AXILLARY, BIOPSY:  -  No carcinoma identified in one lymph node (0/1)  -  See comment   H. SENTINEL LYMPH NODE, LEFT AXILLARY, BIOPSY:  -  No carcinoma identified in one lymph node (0/1)  -  See comment    COMMENT:   E.  There is cauterized epithelium which by immunohistochemistry  maintains a myoepithelial layer (positive for SMM and calponin).  Cytokeratin AE1/3 does not highlight an infiltrative epithelioid  component.   F-H.  Given the lobular-like morphology, cytokeratin AE1/3 was performed  on the lymph nodes to exclude isolated tumor cells in micrometastasis.  Cytokeratin AE1/3 is negative.   01/28/2021 Oncotype testing   Recurrence score 23 with distant recurrence risk of 9 years with AI/Tamoxifen  at 9% She has less than 1% benefit of chemotherapy.    01/28/2021 Cancer Staging   Staging form: Breast, AJCC 8th Edition - Pathologic stage from 01/28/2021: Stage IA (pT1c, pN0, cM0, G2, ER+, PR+, HER2-, Oncotype DX score: 23) - Signed by Sonja Shady Dale, MD on 04/03/2021 Stage prefix: Initial diagnosis Multigene prognostic tests performed: Oncotype DX Recurrence score range: Greater than or equal to 11 Histologic grading system: 3 grade system Residual tumor (R): R0 - None   03/09/2021 - 04/06/2021 Radiation Therapy   Adjuvant radiation with Dr Eloise Hake    03/2021 -  Anti-estrogen oral therapy   Tamoxifen  20mg  once daily starting in late April or Early May 2022.    07/02/2021 Survivorship   SCP delivered by Lacie Burton, NP    01/09/2024 Mammogram   3D diagnostic mammogram IMPRESSION: Stable post lumpectomy changes of the left breast. Stable right breast.   RECOMMENDATION: Recommend continued annual bilateral screening mammographic follow-up.  BI-RADS CATEGORY  2: Benign.       REVIEW OF SYSTEMS:   Constitutional: Denies fevers, chills or abnormal weight loss Eyes: Denies blurriness of vision Ears, nose, mouth, throat, and face: Denies mucositis or sore throat Respiratory: Denies cough, dyspnea or wheezes Cardiovascular: Denies palpitation, chest discomfort or lower extremity swelling Gastrointestinal:  Denies nausea, heartburn or change in bowel habits Skin: Denies abnormal skin  rashes Lymphatics: Denies new lymphadenopathy or easy bruising Neurological:Denies numbness, tingling or new weaknesses Behavioral/Psych: Mood is stable, no new changes  All other systems were reviewed with the patient and are negative.   VITALS:   Today's Vitals   04/25/24 1007 04/25/24 1009  BP: (!) 148/80 (!) 150/90  Pulse: 94   Resp: (!) 21   Temp: 98.1 F (36.7 C)   TempSrc: Temporal   SpO2: 99%   Weight: 164 lb 9.6 oz (74.7 kg)   PainSc: 0-No pain    Body mass index is 30.11 kg/m.   Wt Readings from Last 3 Encounters:  04/25/24 164 lb 9.6 oz (74.7 kg)  03/05/24 163 lb (73.9 kg)  10/25/23 159 lb 1.6 oz (72.2 kg)    Body mass index is 30.11 kg/m.  Performance status (ECOG): 1 - Symptomatic but completely ambulatory  PHYSICAL EXAM:   GENERAL:alert, no distress and comfortable SKIN: skin color, texture, turgor are normal, no rashes or significant lesions EYES: normal, Conjunctiva are pink and non-injected, sclera clear OROPHARYNX:no exudate, no erythema and  lips, buccal mucosa, and tongue normal  NECK: supple, thyroid normal size, non-tender, without nodularity LYMPH:  no palpable lymphadenopathy in the cervical, axillary or inguinal LUNGS: clear to auscultation and percussion with normal breathing effort HEART: regular rate & rhythm and no murmurs and no lower extremity edema ABDOMEN:abdomen soft, non-tender and normal bowel sounds Musculoskeletal:no cyanosis of digits and no clubbing  NEURO: alert & oriented x 3 with fluent speech, no focal motor/sensory deficits BREAST: Well-healed left lumpectomy scar.  No palpable masses or lumps in the left breast.  No nipple inversion or nipple discharge noted today.  There is no axillary lymphadenopathy on the left.  The right breast has no palpable masses or lumps today.  There is no nipple inversion or nipple discharge.  There is no axillary lymphadenopathy on the right.  LABORATORY DATA:  I have reviewed the data as  listed    Component Value Date/Time   NA 139 04/25/2024 0936   NA 137 07/03/2019 1220   K 4.2 04/25/2024 0936   CL 103 04/25/2024 0936   CO2 29 04/25/2024 0936   GLUCOSE 87 04/25/2024 0936   BUN 7 04/25/2024 0936   BUN 6 07/03/2019 1220   CREATININE 0.81 04/25/2024 0936   CALCIUM 9.6 04/25/2024 0936   PROT 7.4 04/25/2024 0936   PROT 7.6 07/03/2019 1220   ALBUMIN 4.7 04/25/2024 0936   ALBUMIN 5.1 (H) 07/03/2019 1220   AST 14 (L) 04/25/2024 0936   ALT 8 04/25/2024 0936   ALKPHOS 57 04/25/2024 0936   BILITOT 0.8 04/25/2024 0936   GFRNONAA >60 04/25/2024 0936   GFRAA 118 07/03/2019 1220    Lab Results  Component Value Date   WBC 4.2 04/25/2024   NEUTROABS 2.5 04/25/2024   HGB 13.2 04/25/2024   HCT 39.5 04/25/2024   MCV 89.8 04/25/2024   PLT 252 04/25/2024

## 2024-04-25 ENCOUNTER — Inpatient Hospital Stay: Payer: BC Managed Care – PPO | Attending: Nurse Practitioner

## 2024-04-25 ENCOUNTER — Inpatient Hospital Stay (HOSPITAL_BASED_OUTPATIENT_CLINIC_OR_DEPARTMENT_OTHER): Payer: Self-pay | Admitting: Nurse Practitioner

## 2024-04-25 VITALS — BP 150/90 | HR 94 | Temp 98.1°F | Resp 21 | Wt 164.6 lb

## 2024-04-25 DIAGNOSIS — Z170 Estrogen receptor positive status [ER+]: Secondary | ICD-10-CM | POA: Diagnosis not present

## 2024-04-25 DIAGNOSIS — Z1721 Progesterone receptor positive status: Secondary | ICD-10-CM | POA: Diagnosis not present

## 2024-04-25 DIAGNOSIS — Z79810 Long term (current) use of selective estrogen receptor modulators (SERMs): Secondary | ICD-10-CM | POA: Insufficient documentation

## 2024-04-25 DIAGNOSIS — C50412 Malignant neoplasm of upper-outer quadrant of left female breast: Secondary | ICD-10-CM | POA: Diagnosis not present

## 2024-04-25 DIAGNOSIS — Z923 Personal history of irradiation: Secondary | ICD-10-CM | POA: Insufficient documentation

## 2024-04-25 DIAGNOSIS — Z1732 Human epidermal growth factor receptor 2 negative status: Secondary | ICD-10-CM | POA: Insufficient documentation

## 2024-04-25 DIAGNOSIS — N951 Menopausal and female climacteric states: Secondary | ICD-10-CM | POA: Insufficient documentation

## 2024-04-25 LAB — CMP (CANCER CENTER ONLY)
ALT: 8 U/L (ref 0–44)
AST: 14 U/L — ABNORMAL LOW (ref 15–41)
Albumin: 4.7 g/dL (ref 3.5–5.0)
Alkaline Phosphatase: 57 U/L (ref 38–126)
Anion gap: 7 (ref 5–15)
BUN: 7 mg/dL (ref 6–20)
CO2: 29 mmol/L (ref 22–32)
Calcium: 9.6 mg/dL (ref 8.9–10.3)
Chloride: 103 mmol/L (ref 98–111)
Creatinine: 0.81 mg/dL (ref 0.44–1.00)
GFR, Estimated: >60 mL/min (ref >60)
Glucose, Bld: 87 mg/dL (ref 70–99)
Potassium: 4.2 mmol/L (ref 3.5–5.1)
Sodium: 139 mmol/L (ref 135–145)
Total Bilirubin: 0.8 mg/dL (ref 0.0–1.2)
Total Protein: 7.4 g/dL (ref 6.5–8.1)

## 2024-04-25 LAB — CBC WITH DIFFERENTIAL (CANCER CENTER ONLY)
Abs Immature Granulocytes: 0.02 10*3/uL (ref 0.00–0.07)
Basophils Absolute: 0.1 10*3/uL (ref 0.0–0.1)
Basophils Relative: 1 %
Eosinophils Absolute: 0.1 10*3/uL (ref 0.0–0.5)
Eosinophils Relative: 3 %
HCT: 39.5 % (ref 36.0–46.0)
Hemoglobin: 13.2 g/dL (ref 12.0–15.0)
Immature Granulocytes: 1 %
Lymphocytes Relative: 28 %
Lymphs Abs: 1.2 10*3/uL (ref 0.7–4.0)
MCH: 30 pg (ref 26.0–34.0)
MCHC: 33.4 g/dL (ref 30.0–36.0)
MCV: 89.8 fL (ref 80.0–100.0)
Monocytes Absolute: 0.3 10*3/uL (ref 0.1–1.0)
Monocytes Relative: 8 %
Neutro Abs: 2.5 10*3/uL (ref 1.7–7.7)
Neutrophils Relative %: 59 %
Platelet Count: 252 10*3/uL (ref 150–400)
RBC: 4.4 MIL/uL (ref 3.87–5.11)
RDW: 12 % (ref 11.5–15.5)
WBC Count: 4.2 10*3/uL (ref 4.0–10.5)
nRBC: 0 % (ref 0.0–0.2)

## 2024-04-27 ENCOUNTER — Encounter: Payer: Self-pay | Admitting: Nurse Practitioner

## 2024-07-04 ENCOUNTER — Ambulatory Visit (HOSPITAL_COMMUNITY)
Admission: RE | Admit: 2024-07-04 | Discharge: 2024-07-04 | Disposition: A | Discharge status: Home / Self Care | Payer: Self-pay | Source: Ambulatory Visit | Attending: Nurse Practitioner | Admitting: Nurse Practitioner

## 2024-07-04 DIAGNOSIS — Z170 Estrogen receptor positive status [ER+]: Secondary | ICD-10-CM | POA: Diagnosis present

## 2024-07-04 DIAGNOSIS — C50412 Malignant neoplasm of upper-outer quadrant of left female breast: Secondary | ICD-10-CM | POA: Insufficient documentation

## 2024-07-04 MED ORDER — GADOBUTROL 1 MMOL/ML IV SOLN
7.0000 mL | Freq: Once | INTRAVENOUS | Status: AC | PRN
  Administered 2024-07-04: 7 mL via INTRAVENOUS

## 2024-07-23 ENCOUNTER — Ambulatory Visit: Payer: Self-pay

## 2024-07-23 NOTE — Telephone Encounter (Signed)
 Contacted pt and appt scheduled.

## 2024-07-23 NOTE — Telephone Encounter (Signed)
 Reason for Disposition . [1] MILD-MODERATE headache AND [2] present > 3 days (72 hours) AND [3] no improvement after using Care Advice  Answer Assessment - Initial Assessment Questions 1. LOCATION: Where does it hurt?      Base of skull to behind eye on left side 2. ONSET: When did the headache start? (e.g., minutes, hours, days)      1 year 3. PATTERN: Does the pain come and go, or has it been constant since it started?     Comes and goes 4. SEVERITY: How bad is the pain? and What does it keep you from doing?  (e.g., Scale 1-10; mild, moderate, or severe)     denies 5. RECURRENT SYMPTOM: Have you ever had headaches before? If Yes, ask: When was the last time? and What happened that time?      yes 6. CAUSE: What do you think is causing the headache?     Stiff neck and shoulder  7. MIGRAINE: Have you been diagnosed with migraine headaches? If Yes, ask: Is this headache similar?      Denies but runs in family 8. HEAD INJURY: Has there been any recent injury to your head?      denies 9. OTHER SYMPTOMS: Do you have any other symptoms? (e.g., fever, stiff neck, eye pain, sore throat, cold symptoms)     Stiff neck from shoulder issues.      Pt scratched nose and it started to bleed.Nose bleed lasted 5-6 mins. Pt thinks a bug flew up nose. Headaches present on and off for a year.Pt is requesting CT scan. RN explained pt would need to be evaluated before treatment is ordered.  Unable to schedule appt before 9/18. CAL stated they will override and schedule appt once CRM is sent.  Protocols used: [facility]

## 2024-07-23 NOTE — Telephone Encounter (Signed)
 FYI Only or Action Required?: Action required by provider: request for appointment.  Patient was last seen in primary care on 12/29/2021 by Hanford Powell BRAVO, NP.  Called Nurse Triage reporting Headache.  Symptoms began several days ago.  Interventions attempted: Nothing.  Symptoms are: stable.  Triage Disposition: See PCP When Office is Open (Within 3 Days)  Patient/caregiver understands and will follow disposition?: Yes

## 2024-07-24 ENCOUNTER — Ambulatory Visit

## 2024-07-24 VITALS — BP 152/82 | HR 75 | Temp 98.0°F | Ht 62.0 in | Wt 165.0 lb

## 2024-07-24 DIAGNOSIS — I10 Essential (primary) hypertension: Secondary | ICD-10-CM | POA: Diagnosis not present

## 2024-07-24 MED ORDER — PROPRANOLOL HCL ER 80 MG PO CP24: 80.0000 mg | ORAL_CAPSULE | Freq: Every day | ORAL | 2 refills | Status: DC

## 2024-07-24 NOTE — Assessment & Plan Note (Addendum)
 BP goal < 130/80. BP above goal in office and on recheck today. BP also above goal on ambulatory BP monitoring. Given concurrent presence of HTN and what is likely occasional migraine headaches, will trial Propanolol 80 mg ER nightly for BP control and prevention of headaches. Advised patient that we may need to increase to dose to get therapeutic effect for the headaches. Sent patient home with a blood pressure log and demonstrated proper techniques for ambulatory blood pressure monitoring.  If this medication proves ineffective, will consider switching to Losartan and adding Sumatriptan for acute migraine. Collecting CMP at next visit in 8 weeks. Patient does have a family history of Glioblastoma in her father, so she would like to go through with brain imaging if headaches are not improved at next visit.

## 2024-07-24 NOTE — Patient Instructions (Signed)
 It was nice to see you today!  As we discussed in clinic:  -I have sent in Propanolol 80 mg ER, which is a capsule we will use to treat high blood pressure and prevent migraines. Please take this medication in the evenings around bedtime. -We sometimes have to increase the dose of this medication to prevent migraines if the 80 mg dose is not enough.  -I am sending you home with a blood pressure log to check BP several times per week. Check in the mornings and record the readings and bring the log with you to the next visit please!  -If headaches/nosebleeds persist despite medication, we can escalate to getting CT/MRI of the brain to rule out other causes.   I will see you back in 8 weeks for a follow up! It was nice to meet you!  If you have any problems before your next visit feel free to message me via MyChart (minor issues or questions) or call the office, otherwise you may reach out to schedule an office visit.  Thank you! Saddie Sacks, PA-C

## 2024-07-24 NOTE — Progress Notes (Signed)
 Established Patient Office Visit  Subjective   Patient ID: Cindy [name], female    DOB: April 11, 1971  Age: 53 y.o. MRN: [id number]  Chief Complaint  Patient presents with   Headache    Onset: over the past weekend Meds taken: Tylenol     HPI  Cindy [name] is a 53 y.o. y/o female who presents to the clinic today c/o headache. Patient reports that on Saturday (07/21/24), she was sitting on the couch resting when she began to suddenly experience a left sided nose bleed that followed with a migraine-type headache on the left side. She reports that the nose bleed lasted several minutes, but subsided. The headache lasted the rest of the day but was mild and similar to the headaches she says she experiences 1-2 times per month. Patient denies any associated vision changes, dizziness, slurred speech, confusion, chest pain, edema, hearing changes, or numbness/tingling when this occurred. Reports that the headache lingered for about 24 hours before it went away. She reports that she does check her BP at home regularly and it bounces all over the place. Reports readings are never lower than 130's SBP. Not currently on any medications for BP or headaches.     ROS Per HPI.    Objective:     BP (!) 152/82 (BP Location: Right Arm, Patient Position: Sitting, Cuff Size: Normal)   Pulse 75   Temp 98 F (36.7 C) (Oral)   Ht 5' 2 (1.575 m)   Wt 165 lb (74.8 kg)   LMP  (LMP Unknown) Comment: last period 1 yr 10-2021  SpO2 (!) 75%   BMI 30.18 kg/m    Physical Exam Constitutional:      General: She is not in acute distress.    Appearance: Normal appearance.  Eyes:     General: No visual field deficit.    Extraocular Movements: Extraocular movements intact.     Pupils: Pupils are equal, round, and reactive to light.  Cardiovascular:     Rate and Rhythm: Normal rate and regular rhythm.     Heart sounds: Normal heart sounds. No murmur heard.    No friction rub. No gallop.  Pulmonary:      Effort: Pulmonary effort is normal. No respiratory distress.     Breath sounds: Normal breath sounds.  Musculoskeletal:        General: No swelling.  Skin:    General: Skin is warm and dry.  Neurological:     General: No focal deficit present.     Mental Status: She is alert.     Cranial Nerves: No cranial nerve deficit, dysarthria or facial asymmetry.  Psychiatric:        Mood and Affect: Mood normal.        Behavior: Behavior normal.        Thought Content: Thought content normal.    No results found for any visits on 07/24/24.    The ASCVD Risk score (Arnett DK, et al., 2019) failed to calculate for the following reasons:   Cannot find a previous HDL lab   Cannot find a previous total cholesterol lab    Assessment & Plan:   Essential hypertension Assessment & Plan: BP goal < 130/80. BP above goal in office and on recheck today. BP also above goal on ambulatory BP monitoring. Given concurrent presence of HTN and what is likely occasional migraine headaches, will trial Propanolol 80 mg ER nightly for BP control and prevention of headaches. Advised patient that we may need to  increase to dose to get therapeutic effect for the headaches. Sent patient home with a blood pressure log and demonstrated proper techniques for ambulatory blood pressure monitoring.  If this medication proves ineffective, will consider switching to Losartan and adding Sumatriptan for acute migraine. Collecting CMP at next visit in 8 weeks. Patient does have a family history of Glioblastoma in her father, so she would like to go through with brain imaging if headaches are not improved at next visit.   Other orders -     Propranolol  HCl ER; Take 1 capsule (80 mg total) by mouth at bedtime.  Dispense: 30 capsule; Refill: 2   I have personally spent  30 minutes involved in face-to-face and non-face-to-face activities for this patient on the day of the visit. Professional time spent includes the following  activities: Preparing to see the patient (review of tests), Obtaining and/or reviewing separately obtained history (admission/discharge record), Performing a medically appropriate examination and/or evaluation , Ordering medications/tests/procedures, referring and communicating with other health care professionals, Documenting clinical information in the EMR, Independently interpreting results (not separately reported), Communicating results to the patient/family/caregiver, Counseling and educating the patient/family/caregiver and Care coordination (not separately reported).    Return in about 8 weeks (around 09/18/2024) for HTN.    Cindy JULIANNA Sacks, PA-C

## 2024-09-03 ENCOUNTER — Ambulatory Visit: Payer: Self-pay | Attending: General Surgery

## 2024-09-03 VITALS — Wt 163.2 lb

## 2024-09-03 DIAGNOSIS — Z483 Aftercare following surgery for neoplasm: Secondary | ICD-10-CM | POA: Insufficient documentation
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
## 2024-09-07 ENCOUNTER — Other Ambulatory Visit: Payer: Self-pay

## 2024-09-07 DIAGNOSIS — I10 Essential (primary) hypertension: Secondary | ICD-10-CM

## 2024-09-11 ENCOUNTER — Other Ambulatory Visit

## 2024-09-11 DIAGNOSIS — I10 Essential (primary) hypertension: Secondary | ICD-10-CM

## 2024-09-12 ENCOUNTER — Ambulatory Visit: Payer: Self-pay

## 2024-09-12 LAB — CBC WITH DIFFERENTIAL/PLATELET
Basophils Absolute: 0.1 x10E3/uL (ref 0.0–0.2)
Basos: 1 %
EOS (ABSOLUTE): 0.1 x10E3/uL (ref 0.0–0.4)
Eos: 2 %
Hematocrit: 38 % (ref 34.0–46.6)
Hemoglobin: 12.4 g/dL (ref 11.1–15.9)
Immature Grans (Abs): 0 x10E3/uL (ref 0.0–0.1)
Immature Granulocytes: 0 %
Lymphocytes Absolute: 1.3 x10E3/uL (ref 0.7–3.1)
Lymphs: 23 %
MCH: 30.7 pg (ref 26.6–33.0)
MCHC: 32.6 g/dL (ref 31.5–35.7)
MCV: 94 fL (ref 79–97)
Monocytes Absolute: 0.4 x10E3/uL (ref 0.1–0.9)
Monocytes: 7 %
Neutrophils Absolute: 4 x10E3/uL (ref 1.4–7.0)
Neutrophils: 66 %
Platelets: 250 x10E3/uL (ref 150–450)
RBC: 4.04 x10E6/uL (ref 3.77–5.28)
RDW: 12.8 % (ref 11.7–15.4)
WBC: 5.9 x10E3/uL (ref 3.4–10.8)

## 2024-09-12 LAB — COMPREHENSIVE METABOLIC PANEL WITH GFR
ALT: 10 IU/L (ref 0–32)
AST: 18 IU/L (ref 0–40)
Albumin: 4.5 g/dL (ref 3.8–4.9)
Alkaline Phosphatase: 58 IU/L (ref 49–135)
BUN/Creatinine Ratio: 10 (ref 9–23)
BUN: 8 mg/dL (ref 6–24)
Bilirubin Total: 0.6 mg/dL (ref 0.0–1.2)
CO2: 22 mmol/L (ref 20–29)
Calcium: 9.2 mg/dL (ref 8.7–10.2)
Chloride: 98 mmol/L (ref 96–106)
Creatinine, Ser: 0.77 mg/dL (ref 0.57–1.00)
Globulin, Total: 2.5 g/dL (ref 1.5–4.5)
Glucose: 88 mg/dL (ref 70–99)
Potassium: 4.1 mmol/L (ref 3.5–5.2)
Sodium: 134 mmol/L (ref 134–144)
Total Protein: 7 g/dL (ref 6.0–8.5)
eGFR: 92 mL/min/1.73 (ref >59)

## 2024-09-18 ENCOUNTER — Ambulatory Visit

## 2024-09-18 VITALS — BP 119/75 | HR 82 | Temp 98.0°F | Ht 62.0 in | Wt 167.0 lb

## 2024-09-18 DIAGNOSIS — G43909 Migraine, unspecified, not intractable, without status migrainosus: Secondary | ICD-10-CM | POA: Insufficient documentation

## 2024-09-18 DIAGNOSIS — C50919 Malignant neoplasm of unspecified site of unspecified female breast: Secondary | ICD-10-CM | POA: Diagnosis not present

## 2024-09-18 DIAGNOSIS — G43009 Migraine without aura, not intractable, without status migrainosus: Secondary | ICD-10-CM | POA: Diagnosis not present

## 2024-09-18 DIAGNOSIS — E66811 Obesity, class 1: Secondary | ICD-10-CM | POA: Diagnosis not present

## 2024-09-18 DIAGNOSIS — I10 Essential (primary) hypertension: Secondary | ICD-10-CM | POA: Diagnosis not present

## 2024-09-18 MED ORDER — PROPRANOLOL HCL ER 80 MG PO CP24: 80.0000 mg | ORAL_CAPSULE | Freq: Every day | ORAL | 3 refills | Status: DC

## 2024-09-18 MED ORDER — COVID-19 MRNA VAC-TRIS(PFIZER) 30 MCG/0.3ML IM SUSY: 0.3000 mL | PREFILLED_SYRINGE | Freq: Once | INTRAMUSCULAR | 0 refills | Status: AC

## 2024-09-18 NOTE — Assessment & Plan Note (Signed)
 BP Goal <140/90. BP now well within goal in office and on ambulatory BP monitoring. CMP and CBC WNL. Continue propranolol  80 mg ER nightly for BP control. Will continue to monitor.

## 2024-09-18 NOTE — Assessment & Plan Note (Signed)
 Migraine episodes decreased in frequency and severity. - Continue current management with Propranolol  80 mg ER nightly and over-the-counter Tylenol  and ibuprofen as needed.

## 2024-09-18 NOTE — Assessment & Plan Note (Signed)
 Follows with oncology and currently on Tamoxifen .

## 2024-09-18 NOTE — Patient Instructions (Signed)
 VISIT SUMMARY: Cindy [name], you had a follow-up visit today to discuss your hypertension, migraine symptoms, and ongoing breast cancer treatment. We also reviewed your general health maintenance and planned for your next physical exam.  YOUR PLAN: -HYPERTENSION: Hypertension, or high blood pressure, is well-controlled with your current medication. You may occasionally see errors on your home monitor and sometimes have a lower heart rate due to the medication. Please continue your current medication, monitor your blood pressure at home twice a week, and report any significant changes. We will schedule a follow-up in six months unless your blood pressure exceeds 140 mmHg.  -MIGRAINE: Your migraine episodes have decreased in frequency and severity. Continue managing any minor discomfort with over-the-counter Tylenol  and ibuprofen as needed.  -GENERAL HEALTH MAINTENANCE: You are considering getting the flu and COVID vaccines at the pharmacy. I will provide a prescription for the COVID vaccine. Please get the flu vaccine at the pharmacy as well.  INSTRUCTIONS: Please schedule your next physical exam in January. Before the exam, we will need to order a full panel of blood work including A1c, cholesterol, and vitamin D.  If you have any problems before your next visit feel free to message me via MyChart (minor issues or questions) or call the office, otherwise you may reach out to schedule an office visit.  Thank you! Saddie Sacks, PA-C

## 2024-09-18 NOTE — Progress Notes (Signed)
 Established Patient Office Visit  Subjective   Patient ID: Cindy [name], female    DOB: December 06, 1971  Age: 53 y.o. MRN: [id number]  Chief Complaint  Patient presents with   Medical Management of Chronic Issues    HPI  History of Present Illness   Cindy [name] is a 53 year old female with hypertension who presents for a follow-up visit.  Antihypertensive therapy tolerance and side effects - Patient was started on Propranolol  XR 80 mg 3 months ago due to newly diagnosis hypertension and suspected migraines.  - Initially experienced side effects including feeling 'funny' and sleepy, especially when taking medication at night - Side effects have diminished over time - Reduction in palpitations and improved anxiety since starting therapy  Headache and migraine symptoms - Significant improvement in headache frequency and severity - Occasionally experiences prodromal headache symptoms, but these do not progress to full migraine - Only minor discomfort present, managed without escalation  Blood pressure and heart rate monitoring - Monitors blood pressure at home with generally good readings; occasional elevated values - Recent blood pressure readings: 124/67, 120/70, 114/66 - Home blood pressure monitor occasionally gives error messages requiring reset - Heart rate stable, with some lower readings (55 and 58)      ROS Per HPI.    Objective:     BP 119/75   Pulse 82   Temp 98 F (36.7 C) (Oral)   Ht 5' 2 (1.575 m)   Wt 167 lb (75.8 kg)   LMP  (LMP Unknown) Comment: last period 1 yr 10-2021  SpO2 95%   BMI 30.54 kg/m    Physical Exam Constitutional:      General: Cindy [name] is not in acute distress.    Appearance: Normal appearance.  Cardiovascular:     Rate and Rhythm: Normal rate and regular rhythm.     Heart sounds: Normal heart sounds. No murmur heard.    No friction rub. No gallop.  Pulmonary:     Effort: Pulmonary effort is normal. No respiratory distress.      Breath sounds: Normal breath sounds.  Musculoskeletal:        General: No swelling.  Skin:    General: Skin is warm and dry.  Neurological:     General: No focal deficit present.     Mental Status: Cindy [name] is alert.  Psychiatric:        Mood and Affect: Mood normal.        Behavior: Behavior normal.        Thought Content: Thought content normal.     No results found for any visits on 09/18/24.  Last CBC Lab Results  Component Value Date   WBC 5.9 09/11/2024   HGB 12.4 09/11/2024   HCT 38.0 09/11/2024   MCV 94 09/11/2024   MCH 30.7 09/11/2024   RDW 12.8 09/11/2024   PLT 250 09/11/2024   Last metabolic panel Lab Results  Component Value Date   GLUCOSE 88 09/11/2024   NA 134 09/11/2024   K 4.1 09/11/2024   CL 98 09/11/2024   CO2 22 09/11/2024   BUN 8 09/11/2024   CREATININE 0.77 09/11/2024   EGFR 92 09/11/2024   CALCIUM 9.2 09/11/2024   PROT 7.0 09/11/2024   ALBUMIN 4.5 09/11/2024   LABGLOB 2.5 09/11/2024   AGRATIO 2.0 07/03/2019   BILITOT 0.6 09/11/2024   ALKPHOS 58 09/11/2024   AST 18 09/11/2024   ALT 10 09/11/2024   ANIONGAP 7 04/25/2024      The  ASCVD Risk score (Arnett DK, et al., 2019) failed to calculate for the following reasons:   Cannot find a previous HDL lab   Cannot find a previous total cholesterol lab    Assessment & Plan:   Obesity, Class I, BMI 30-34.9 -     COVID-19 mRNA Vac-TriS(Pfizer); Inject 0.3 mLs into the muscle once for 1 dose.  Dispense: 0.3 mL; Refill: 0  Essential hypertension Assessment & Plan: BP Goal <140/90. BP now well within goal in office and on ambulatory BP monitoring. CMP and CBC WNL. Continue propranolol  80 mg ER nightly for BP control. Will continue to monitor.   Malignant neoplasm of female breast, unspecified estrogen receptor status, unspecified laterality, unspecified site of breast [facility]) Assessment & Plan: Follows with oncology and currently on Tamoxifen .   Migraine without aura and without status  migrainosus, not intractable Assessment & Plan: Migraine episodes decreased in frequency and severity. - Continue current management with Propranolol  80 mg ER nightly and over-the-counter Tylenol  and ibuprofen as needed.   Other orders -     Propranolol  HCl ER; Take 1 capsule (80 mg total) by mouth at bedtime.  Dispense: 30 capsule; Refill: 3     Return in about 4 months (around 01/18/2025) for Physical.    Saddie JULIANNA Sacks, PA-C

## 2024-10-17 ENCOUNTER — Telehealth: Payer: Self-pay

## 2024-10-17 NOTE — Telephone Encounter (Signed)
 Pt informed that a Rx is no longer needed to get this vaccine.

## 2024-10-17 NOTE — Telephone Encounter (Signed)
 Copied from CRM [id number]. Topic: Clinical - Prescription Issue >> Oct 17, 2024 11:00 AM Cindy [name] wrote: Reason for CRM: Patient calling in to see if prescription for COVID vaccine dated on 09/18/2024 is still valid, patient has not went to get vaccine and wants to know if she still can using the same prescription that was given to her or if she needs to get an updated prescription, req a call back in regards to this.

## 2024-10-23 ENCOUNTER — Other Ambulatory Visit: Payer: Self-pay

## 2024-10-23 DIAGNOSIS — Z170 Estrogen receptor positive status [ER+]: Secondary | ICD-10-CM

## 2024-10-23 NOTE — Assessment & Plan Note (Addendum)
 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 Margins and LN negative. RS of 23, distant recurrence risk of 9%. -S/p adjuvant radiation with Dr Shannon 03/09/21-04/06/21.  -she began tamoxifen  in late April/early May 2022.  -FSH from 10/18/22 showed she is still perimenopausal, but close to post menopause.   -Screening mammogram 1/0/24 and MRI 07/06/23 were both benign  -03/2023: FSH perimenopausal (17), she continues tamoxifen  -04/25/2024  -reviewed 3D screening mammogram done 01/09/2024 with benign results.   -07/05/2024 -benign breast MRI. -10/24/2024 - benign breast exam. Continue tamoxifen  daily. Diagnostic mammogram ordered for 12/2024. Plan for labs and follow up in 6 months, sooner if needed.

## 2024-10-23 NOTE — Progress Notes (Signed)
 Patient Care Team: Gayle Saddie JULIANNA DEVONNA as PCP - [facility], [facility] Vernetta, Lonni GRADE, MD as Consulting Physician (Orthopedic Surgery) Tyree Nanetta SAILOR, RN as Oncology Nurse Navigator Ebbie Cough, MD as Consulting Physician (General Surgery) Lanny Callander, MD as Consulting Physician (Hematology) Burton, Lacie K, NP as Nurse Practitioner (Nurse Practitioner) Shannon Agent, MD as Consulting Physician (Radiation Oncology)  Clinic Day:  10/24/2024  Referring physician: No ref. provider found  ASSESSMENT & PLAN:   Assessment & Plan: Malignant neoplasm of upper-outer quadrant of left breast in female, estrogen receptor positive (HCC) Stage IA, pT1cN0M0, ER+/PR+/HER2-, Grade II, RS 23 -Screening detected invasive ductal carcinoma diagnosed 01/01/2021  -S/p left lumpectomy with SLNB by Dr Ebbie on 01/28/21, path showed 1.2cm IDC, grade 2 and intermediate grade DCIS. Margins and LN negative. RS of 23, distant recurrence risk of 9%. -S/p adjuvant radiation with Dr Shannon 03/09/21-04/06/21.  -she began tamoxifen  in late April/early May 2022.  -FSH from 10/18/22 showed she is still perimenopausal, but close to post menopause.   -Screening mammogram 1/0/24 and MRI 07/06/23 were both benign  -03/2023: FSH perimenopausal (17), she continues tamoxifen  -04/25/2024  -reviewed 3D screening mammogram done 01/09/2024 with benign results.   -07/05/2024 -benign breast MRI. -10/24/2024 - benign breast exam. Continue tamoxifen  daily. Diagnostic mammogram ordered for 12/2024. Plan for labs and follow up in 6 months, sooner if needed.   Left breast cancer Continues tamoxifen  daily. She does have mild hot flashes and night sweats. These are manageable. They are gradually improving. She had benign breast MRI on 07/05/2024. Bilateral diagnostic mammogram due in 12/2024 and was ordered as part of today's visit. She reports no changes, concerns, lumps, or masses in either  breast.   Plan: Labs reviewed.  -unremarkable.  Reviewed bilateral breast MRI from 06/2025 with benign results. Diagnostic mammogram ordered for 12/2024.  Continue with tamoxifen  daily.  Expect labs and follow up in 6 months, sooner if needed.   The patient understands the plans discussed today and is in agreement with them.  She knows to contact our office if she develops concerns prior to her next appointment.  I provided 25 minutes of face-to-face time during this encounter and > 50% was spent counseling as documented under my assessment and plan.    Powell FORBES Lessen, NP  [facility] - A DEPT OF [facility] [address] [place] [address] Dept: [phone] Dept Fax: [phone]   Orders Placed This Encounter  Procedures   MM 3D DIAGNOSTIC MAMMOGRAM BILATERAL BREAST    INS:[facility] PI#:[id number] PT 01/09/24@[facility] /NO ISSUES/NO NEEDS/yes HX BR CANCER/lumpectomy left BR SX'S  PT AWARE OF $75 NO SHOW/CANCELLATION FEES-KS    Standing Status:   Future    Expected Date:   01/09/2025    Expiration Date:   10/24/2025    Reason for Exam (SYMPTOM  OR DIAGNOSIS REQUIRED):   breast cancer follow up. breast density category C    Is the patient pregnant?:   No    Preferred imaging location?:   [facility]      CHIEF COMPLAINT:  CC: Left breast cancer, ER +  Current Treatment: Tamoxifen  20 mg daily (started May 2022)  INTERVAL HISTORY:  Cindy [name] is here today for repeat clinical assessment.  She was last seen by me on 04/25/2024.  Since then, she had MR of bilateral breasts.  Results were benign.  She has breast density category C.  Bilateral  diagnostic mammogram ordered for January 2026.  She continues tamoxifen .  She does have mild hot flashes and night sweats as a result.  Symptoms are manageable and improving.  She reports no menstrual period in past few years.  Continues to have PMS type symptoms every month.  She  denies new lumps, masses, or changes in either breast.  She denies chest pain, chest pressure, or shortness of breath. She denies headaches or visual disturbances. She denies abdominal pain, nausea, vomiting, or changes in bowel or bladder habits.   She denies fevers or chills. She denies pain. Her appetite is good. Her weight has been stable.  I have reviewed the past medical history, past surgical history, social history and family history with the patient and they are unchanged from previous note.  ALLERGIES:  is allergic to latex.  MEDICATIONS:  Current Outpatient Medications  Medication Sig Dispense Refill   acetaminophen  (TYLENOL ) 500 MG tablet Take 500 mg by mouth as needed.     Ibuprofen 200 MG CAPS Take 400 mg by mouth as needed.     propranolol  ER (INDERAL  LA) 80 MG 24 hr capsule Take 1 capsule (80 mg total) by mouth at bedtime. 30 capsule 3   tamoxifen  (NOLVADEX ) 20 MG tablet TAKE 1 TABLET (20 MG TOTAL) BY MOUTH DAILY. 90 tablet 3   No current facility-administered medications for this visit.    HISTORY OF PRESENT ILLNESS:   Oncology History Overview Note  Cancer Staging Malignant neoplasm of upper-outer quadrant of left breast in female, estrogen receptor positive (HCC) Staging form: Breast, AJCC 8th Edition - Clinical stage from 01/01/2021: Stage IA (cT1c, cN0, cM0, G2, ER+, PR+, HER2-) - Signed by Lanny Callander, MD on 01/20/2021 Stage prefix: Initial diagnosis    Malignant neoplasm of upper-outer quadrant of left breast in female, estrogen receptor positive (HCC)  01/01/2021 Mammogram   Mammogram 01/01/21  IMPRESSION: 1. 11x7x8 mm mass in the 1 o'clock position of the left breast 8cmfn, highly suspicious for breast malignancy. No left axillary lymphadenopathy.   01/01/2021 Initial Biopsy   Diagnosis 01/01/21 Breast, left, needle core biopsy, 1 o'clock, upper outer quadrant, ribbon clip - INVASIVE MAMMARY CARCINOMA, GRADE II. - SEE MICROSCOPIC DESCRIPTION. Microscopic  Comment The greatest tumor length is 0.9 cm. E-cadherin and breast prognostic profile will be performed. 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    01/01/2021 Receptors her2   PROGNOSTIC INDICATORS Results: IMMUNOHISTOCHEMICAL AND MORPHOMETRIC ANALYSIS PERFORMED MANUALLY The tumor cells are EQUIVOCAL for Her2 (2+). HER2 by FISH will be performed and the results reported separately Estrogen Receptor: 95%, POSITIVE, STRONG STAINING INTENSITY Progesterone Receptor: 50%, POSITIVE, MODERATE STAINING INTENSITY Proliferation Marker Ki67: 20% FLUORESCENCE IN-SITU HYBRIDIZATION Results: GROUP 5: HER2 **NEGATIVE** Equivocal form of amplification of the HER2 gene was detected in the IHC 2+ tissue sample received from this individual. HER2 FISH was performed by a technologist and cell imaging and analysis on the BioView.   01/01/2021 Cancer Staging   Staging form: Breast, AJCC 8th Edition - Clinical stage from 01/01/2021: Stage IA (cT1c, cN0, cM0, G2, ER+, PR+, HER2-) - Signed by Lanny Callander, MD on 01/20/2021   01/16/2021 Initial Diagnosis   Malignant neoplasm of upper-outer quadrant of left breast in female, estrogen receptor positive (HCC)   01/28/2021 Surgery   LEFT BREAST LUMPECTOMY WITH RADIOACTIVE SEED AND LEFT AXILLARY SENTINEL LYMPH NODE BIOPSY by Dr Ebbie    01/28/2021 Pathology Results   FINAL MICROSCOPIC DIAGNOSIS:   A. BREAST, LEFT W/SEED, LUMPECTOMY:  -  Invasive ductal carcinoma, Nottingham grade 2 of 3, 1.2 cm  -  Ductal carcinoma in-situ, intermediate grade  -  Calcifications associated with carcinoma  -  Margins uninvolved by carcinoma (see part B-E for final margin  status)  -  Previous biopsy site changes present  -  See oncology table below   B. BREAST, LEFT ADDITIONAL MEDIAL MARGIN, EXCISION:  -  No residual carcinoma identified   C. BREAST, LEFT ADDITIONAL POSTERIOR MARGIN, EXCISION:  -  No residual carcinoma identified   D.  BREAST, LEFT ADDITIONAL LATERAL MARGIN, EXCISION:  -  No residual carcinoma   E. BREAST, LEFT ADDITIONAL SUPERIOR MARGIN, EXCISION:  -  No residual carcinoma identified  -  See comment   F. SENTINEL LYMPH NODE, LEFT AXILLARY, BIOPSY:  -  No carcinoma identified in one lymph node (0/1)  -  See comment   G. SENTINEL LYMPH NODE, LEFT AXILLARY, BIOPSY:  -  No carcinoma identified in one lymph node (0/1)  -  See comment   H. SENTINEL LYMPH NODE, LEFT AXILLARY, BIOPSY:  -  No carcinoma identified in one lymph node (0/1)  -  See comment   COMMENT:   E.  There is cauterized epithelium which by immunohistochemistry  maintains a myoepithelial layer (positive for SMM and calponin).  Cytokeratin AE1/3 does not highlight an infiltrative epithelioid  component.   F-H.  Given the lobular-like morphology, cytokeratin AE1/3 was performed  on the lymph nodes to exclude isolated tumor cells in micrometastasis.  Cytokeratin AE1/3 is negative.   01/28/2021 Oncotype testing   Recurrence score 23 with distant recurrence risk of 9 years with AI/Tamoxifen  at 9% She has less than 1% benefit of chemotherapy.    01/28/2021 Cancer Staging   Staging form: Breast, AJCC 8th Edition - Pathologic stage from 01/28/2021: Stage IA (pT1c, pN0, cM0, G2, ER+, PR+, HER2-, Oncotype DX score: 23) - Signed by Lanny Callander, MD on 04/03/2021 Stage prefix: Initial diagnosis Multigene prognostic tests performed: Oncotype DX Recurrence score range: Greater than or equal to 11 Histologic grading system: 3 grade system Residual tumor (R): R0 - None   03/09/2021 - 04/06/2021 Radiation Therapy   Adjuvant radiation with Dr Shannon    03/2021 -  Anti-estrogen oral therapy   Tamoxifen  20mg  once daily starting in late April or Early May 2022.    07/02/2021 Survivorship   SCP delivered by Lacie Burton, NP    01/09/2024 Mammogram   3D diagnostic mammogram IMPRESSION: Stable post lumpectomy changes of the left breast. Stable right breast.    RECOMMENDATION: Recommend continued annual bilateral screening mammographic follow-up.  BI-RADS CATEGORY  2: Benign.   07/05/2024 Imaging   MR bilateral breast W/W0 contrast Ancillary findings:  None.   IMPRESSION: 1. No MR evidence of breast malignancy.   RECOMMENDATION: Bilateral mammogram in 6 months to resume annual mammogram schedule.   Annual bilateral screening breast MR/contrast mammography if this patient is at high lifetime risk for developing breast cancer (greater than 20%).   BI-RADS CATEGORY  2: Benign.       REVIEW OF SYSTEMS:   Constitutional: Denies fevers, chills or abnormal weight loss Eyes: Denies blurriness of vision Ears, nose, mouth, throat, and face: Denies mucositis or sore throat Respiratory: Denies cough, dyspnea or wheezes Cardiovascular: Denies palpitation, chest discomfort or lower extremity swelling Gastrointestinal:  Denies nausea, heartburn or change in bowel habits Skin: Denies abnormal skin rashes Lymphatics: Denies new lymphadenopathy or easy bruising Neurological:Denies numbness, tingling or new weaknesses Behavioral/Psych: Mood  is stable, no new changes  All other systems were reviewed with the patient and are negative.   VITALS:   Today's Vitals   10/24/24 1031 10/24/24 1051  BP: (!) 145/85   Pulse: 67   Resp: 20   Temp: 97.9 F (36.6 C)   SpO2: 98%   Weight: 169 lb 1.6 oz (76.7 kg)   PainSc:  0-No pain   Body mass index is 30.93 kg/m.   Wt Readings from Last 3 Encounters:  10/24/24 169 lb 1.6 oz (76.7 kg)  09/18/24 167 lb (75.8 kg)  09/03/24 163 lb 4 oz (74 kg)    Body mass index is 30.93 kg/m.  Performance status (ECOG): 1 - Symptomatic but completely ambulatory  PHYSICAL EXAM:   GENERAL:alert, no distress and comfortable SKIN: skin color, texture, turgor are normal, no rashes or significant lesions EYES: normal, Conjunctiva are pink and non-injected, sclera clear OROPHARYNX:no exudate, no erythema and lips,  buccal mucosa, and tongue normal  NECK: supple, thyroid normal size, non-tender, without nodularity LYMPH:  no palpable lymphadenopathy in the cervical, axillary or inguinal LUNGS: clear to auscultation and percussion with normal breathing effort HEART: regular rate & rhythm and no murmurs and no lower extremity edema ABDOMEN:abdomen soft, non-tender and normal bowel sounds Musculoskeletal:no cyanosis of digits and no clubbing  NEURO: alert & oriented x 3 with fluent speech, no focal motor/sensory deficits BREAST: there is well healed lumpectomy scar on the left breast. Expected radiation changes are noted. There are no palpable masses or lumps on the left side. There is no nipple inversion or nipple discharge. There is no axillary lymphadenopathy on the left. There are no palpable masses or lumps on the right side. There is no nipple inversion or nipple discharge. There is no axillary lymphadenopathy on the right.   LABORATORY DATA:  I have reviewed the data as listed    Component Value Date/Time   NA 136 10/24/2024 1003   NA 134 09/11/2024 0946   K 4.2 10/24/2024 1003   CL 101 10/24/2024 1003   CO2 27 10/24/2024 1003   GLUCOSE 91 10/24/2024 1003   BUN 9 10/24/2024 1003   BUN 8 09/11/2024 0946   CREATININE 0.83 10/24/2024 1003   CALCIUM 9.4 10/24/2024 1003   PROT 7.6 10/24/2024 1003   PROT 7.0 09/11/2024 0946   ALBUMIN 4.6 10/24/2024 1003   ALBUMIN 4.5 09/11/2024 0946   AST 15 10/24/2024 1003   ALT 8 10/24/2024 1003   ALKPHOS 50 10/24/2024 1003   BILITOT 0.6 10/24/2024 1003   GFRNONAA >60 10/24/2024 1003   GFRAA 118 07/03/2019 1220    Lab Results  Component Value Date   WBC 4.5 10/24/2024   NEUTROABS 2.8 10/24/2024   HGB 13.4 10/24/2024   HCT 39.3 10/24/2024   MCV 88.7 10/24/2024   PLT 241 10/24/2024

## 2024-10-24 ENCOUNTER — Inpatient Hospital Stay (HOSPITAL_BASED_OUTPATIENT_CLINIC_OR_DEPARTMENT_OTHER): Admitting: Nurse Practitioner

## 2024-10-24 ENCOUNTER — Inpatient Hospital Stay: Attending: Nurse Practitioner

## 2024-10-24 VITALS — BP 145/85 | HR 67 | Temp 97.9°F | Resp 20 | Wt 169.1 lb

## 2024-10-24 DIAGNOSIS — C50412 Malignant neoplasm of upper-outer quadrant of left female breast: Secondary | ICD-10-CM | POA: Insufficient documentation

## 2024-10-24 DIAGNOSIS — Z1732 Human epidermal growth factor receptor 2 negative status: Secondary | ICD-10-CM | POA: Insufficient documentation

## 2024-10-24 DIAGNOSIS — Z79810 Long term (current) use of selective estrogen receptor modulators (SERMs): Secondary | ICD-10-CM | POA: Insufficient documentation

## 2024-10-24 DIAGNOSIS — Z1721 Progesterone receptor positive status: Secondary | ICD-10-CM | POA: Insufficient documentation

## 2024-10-24 DIAGNOSIS — Z170 Estrogen receptor positive status [ER+]: Secondary | ICD-10-CM

## 2024-10-24 LAB — CMP (CANCER CENTER ONLY)
ALT: 8 U/L (ref 0–44)
AST: 15 U/L (ref 15–41)
Albumin: 4.6 g/dL (ref 3.5–5.0)
Alkaline Phosphatase: 50 U/L (ref 38–126)
Anion gap: 8 (ref 5–15)
BUN: 9 mg/dL (ref 6–20)
CO2: 27 mmol/L (ref 22–32)
Calcium: 9.4 mg/dL (ref 8.9–10.3)
Chloride: 101 mmol/L (ref 98–111)
Creatinine: 0.83 mg/dL (ref 0.44–1.00)
GFR, Estimated: >60 mL/min (ref >60)
Glucose, Bld: 91 mg/dL (ref 70–99)
Potassium: 4.2 mmol/L (ref 3.5–5.1)
Sodium: 136 mmol/L (ref 135–145)
Total Bilirubin: 0.6 mg/dL (ref 0.0–1.2)
Total Protein: 7.6 g/dL (ref 6.5–8.1)

## 2024-10-24 LAB — CBC WITH DIFFERENTIAL (CANCER CENTER ONLY)
Abs Immature Granulocytes: 0.01 K/uL (ref 0.00–0.07)
Basophils Absolute: 0.1 K/uL (ref 0.0–0.1)
Basophils Relative: 1 %
Eosinophils Absolute: 0.1 K/uL (ref 0.0–0.5)
Eosinophils Relative: 3 %
HCT: 39.3 % (ref 36.0–46.0)
Hemoglobin: 13.4 g/dL (ref 12.0–15.0)
Immature Granulocytes: 0 %
Lymphocytes Relative: 26 %
Lymphs Abs: 1.2 K/uL (ref 0.7–4.0)
MCH: 30.2 pg (ref 26.0–34.0)
MCHC: 34.1 g/dL (ref 30.0–36.0)
MCV: 88.7 fL (ref 80.0–100.0)
Monocytes Absolute: 0.4 K/uL (ref 0.1–1.0)
Monocytes Relative: 8 %
Neutro Abs: 2.8 K/uL (ref 1.7–7.7)
Neutrophils Relative %: 62 %
Platelet Count: 241 K/uL (ref 150–400)
RBC: 4.43 MIL/uL (ref 3.87–5.11)
RDW: 12 % (ref 11.5–15.5)
WBC Count: 4.5 K/uL (ref 4.0–10.5)
nRBC: 0 % (ref 0.0–0.2)

## 2024-11-05 ENCOUNTER — Other Ambulatory Visit: Payer: Self-pay | Admitting: Nurse Practitioner

## 2024-11-06 ENCOUNTER — Encounter: Payer: Self-pay | Admitting: Nurse Practitioner

## 2025-01-09 ENCOUNTER — Other Ambulatory Visit: Payer: Self-pay

## 2025-01-09 ENCOUNTER — Ambulatory Visit
Admission: RE | Admit: 2025-01-09 | Discharge: 2025-01-09 | Disposition: A | Discharge status: Home / Self Care | Source: Ambulatory Visit | Attending: Nurse Practitioner | Admitting: Nurse Practitioner

## 2025-01-09 DIAGNOSIS — Z170 Estrogen receptor positive status [ER+]: Secondary | ICD-10-CM

## 2025-01-09 DIAGNOSIS — Z1159 Encounter for screening for other viral diseases: Secondary | ICD-10-CM

## 2025-01-09 DIAGNOSIS — I10 Essential (primary) hypertension: Secondary | ICD-10-CM

## 2025-01-09 DIAGNOSIS — E66811 Obesity, class 1: Secondary | ICD-10-CM

## 2025-01-10 ENCOUNTER — Other Ambulatory Visit

## 2025-01-10 DIAGNOSIS — I10 Essential (primary) hypertension: Secondary | ICD-10-CM

## 2025-01-10 DIAGNOSIS — Z1159 Encounter for screening for other viral diseases: Secondary | ICD-10-CM

## 2025-01-10 DIAGNOSIS — E66811 Obesity, class 1: Secondary | ICD-10-CM

## 2025-01-11 LAB — COMPREHENSIVE METABOLIC PANEL WITH GFR
ALT: 7 IU/L (ref 0–32)
AST: 12 IU/L (ref 0–40)
Albumin: 4.4 g/dL (ref 3.8–4.9)
Alkaline Phosphatase: 54 IU/L (ref 49–135)
BUN/Creatinine Ratio: 12 (ref 9–23)
BUN: 9 mg/dL (ref 6–24)
Bilirubin Total: 0.5 mg/dL (ref 0.0–1.2)
CO2: 25 mmol/L (ref 20–29)
Calcium: 9.1 mg/dL (ref 8.7–10.2)
Chloride: 98 mmol/L (ref 96–106)
Creatinine, Ser: 0.75 mg/dL (ref 0.57–1.00)
Globulin, Total: 2.3 g/dL (ref 1.5–4.5)
Glucose: 89 mg/dL (ref 70–99)
Potassium: 4.2 mmol/L (ref 3.5–5.2)
Sodium: 136 mmol/L (ref 134–144)
Total Protein: 6.7 g/dL (ref 6.0–8.5)
eGFR: 95 mL/min/1.73

## 2025-01-11 LAB — CBC WITH DIFFERENTIAL/PLATELET
Basophils Absolute: 0.1 x10E3/uL (ref 0.0–0.2)
Basos: 1 %
EOS (ABSOLUTE): 0.2 x10E3/uL (ref 0.0–0.4)
Eos: 3 %
Hematocrit: 38.6 % (ref 34.0–46.6)
Hemoglobin: 12.5 g/dL (ref 11.1–15.9)
Immature Grans (Abs): 0 x10E3/uL (ref 0.0–0.1)
Immature Granulocytes: 0 %
Lymphocytes Absolute: 1.4 x10E3/uL (ref 0.7–3.1)
Lymphs: 28 %
MCH: 30.6 pg (ref 26.6–33.0)
MCHC: 32.4 g/dL (ref 31.5–35.7)
MCV: 95 fL (ref 79–97)
Monocytes Absolute: 0.4 x10E3/uL (ref 0.1–0.9)
Monocytes: 8 %
Neutrophils Absolute: 2.9 x10E3/uL (ref 1.4–7.0)
Neutrophils: 60 %
Platelets: 232 x10E3/uL (ref 150–450)
RBC: 4.08 x10E6/uL (ref 3.77–5.28)
RDW: 12.6 % (ref 11.7–15.4)
WBC: 4.9 x10E3/uL (ref 3.4–10.8)

## 2025-01-11 LAB — LIPID PANEL
Chol/HDL Ratio: 2.8 ratio (ref 0.0–4.4)
Cholesterol, Total: 152 mg/dL (ref 100–199)
HDL: 54 mg/dL
LDL Chol Calc (NIH): 74 mg/dL (ref 0–99)
Triglycerides: 135 mg/dL (ref 0–149)
VLDL Cholesterol Cal: 24 mg/dL (ref 5–40)

## 2025-01-11 LAB — VITAMIN D 25 HYDROXY (VIT D DEFICIENCY, FRACTURES): Vit D, 25-Hydroxy: 95.2 ng/mL (ref 30.0–100.0)

## 2025-01-11 LAB — HEPATITIS C ANTIBODY: Hep C Virus Ab: NONREACTIVE

## 2025-01-11 LAB — HEMOGLOBIN A1C
Est. average glucose Bld gHb Est-mCnc: 117 mg/dL
Hgb A1c MFr Bld: 5.7 % — ABNORMAL HIGH (ref 4.8–5.6)

## 2025-01-11 LAB — TSH: TSH: 1.19 u[IU]/mL (ref 0.450–4.500)

## 2025-01-13 ENCOUNTER — Ambulatory Visit: Payer: Self-pay

## 2025-01-17 ENCOUNTER — Ambulatory Visit (INDEPENDENT_AMBULATORY_CARE_PROVIDER_SITE_OTHER)

## 2025-01-17 VITALS — BP 149/79 | HR 63 | Temp 98.2°F | Ht 62.0 in | Wt 172.0 lb

## 2025-01-17 DIAGNOSIS — Z170 Estrogen receptor positive status [ER+]: Secondary | ICD-10-CM

## 2025-01-17 DIAGNOSIS — K219 Gastro-esophageal reflux disease without esophagitis: Secondary | ICD-10-CM | POA: Insufficient documentation

## 2025-01-17 DIAGNOSIS — I10 Essential (primary) hypertension: Secondary | ICD-10-CM

## 2025-01-17 DIAGNOSIS — Z23 Encounter for immunization: Secondary | ICD-10-CM

## 2025-01-17 DIAGNOSIS — C50412 Malignant neoplasm of upper-outer quadrant of left female breast: Secondary | ICD-10-CM

## 2025-01-17 DIAGNOSIS — Z0000 Encounter for general adult medical examination without abnormal findings: Secondary | ICD-10-CM | POA: Diagnosis not present

## 2025-01-17 DIAGNOSIS — G43009 Migraine without aura, not intractable, without status migrainosus: Secondary | ICD-10-CM | POA: Diagnosis not present

## 2025-01-17 MED ORDER — PROPRANOLOL HCL ER 80 MG PO CP24: 80.0000 mg | ORAL_CAPSULE | Freq: Every day | ORAL | 3 refills | Status: AC

## 2025-01-17 MED ORDER — PANTOPRAZOLE SODIUM 40 MG PO TBEC: 40.0000 mg | DELAYED_RELEASE_TABLET | Freq: Every day | ORAL | 3 refills | Status: AC

## 2025-01-17 NOTE — Assessment & Plan Note (Signed)
 Heartburn and sour stomach ongoing for several months. Responds well to her husband's pantoprazole . Symptoms include burning throat at night. - Prescribed pantoprazole  40 mg for prn daily use

## 2025-01-17 NOTE — Assessment & Plan Note (Signed)
 BP Goal <140/90. BP above goal in office, but well within goal on ambulatory BP monitoring. CMP and CBC WNL. Advised to continue checking BP at home and notify of readings persistently greater than 140. Patient verbalized understanding and was in agreement with the plan. Continue propranolol  80 mg ER nightly for BP control. Will continue to monitor.

## 2025-01-17 NOTE — Assessment & Plan Note (Signed)
 Follows with Powell Lessen with Oncology. On Tamoxifen  therapy.   Per last oncology A&P:  Stage IA, pT1cN0M0, ER+/PR+/HER2-, Grade II, RS 23 -Screening detected invasive ductal carcinoma diagnosed 01/01/2021  -S/p left lumpectomy with SLNB by Dr Ebbie on 01/28/21, path showed 1.2cm IDC, grade 2 and intermediate grade DCIS. Margins and LN negative. RS of 23, distant recurrence risk of 9%. -S/p adjuvant radiation with Dr Shannon 03/09/21-04/06/21.  -she began tamoxifen  in late April/early May 2022.  -FSH from 10/18/22 showed she is still perimenopausal, but close to post menopause.   -Screening mammogram 1/0/24 and MRI 07/06/23 were both benign  -03/2023: FSH perimenopausal (17), she continues tamoxifen  -04/25/2024  -reviewed 3D screening mammogram done 01/09/2024 with benign results.   -07/05/2024 -benign breast MRI. -10/24/2024 - benign breast exam. Continue tamoxifen  daily. Diagnostic mammogram ordered for 12/2024. Plan for labs and follow up in 6 months, sooner if needed.

## 2025-01-17 NOTE — Assessment & Plan Note (Signed)
 Blood work normal. Cholesterol improved post-alcohol cessation. A1c at 5.7, indicating prediabetes. Thyroid and vitamin D  normal. Mammogram up to date. Colonoscopy due December 2027. Pap smear pending. Eligible for shingles and pneumonia vaccines. - Administered shingles and pneumonia vaccines today. - Ensure Pap smear is completed and records obtained from OBGYN. - Continue monitoring A1c levels.

## 2025-01-17 NOTE — Patient Instructions (Signed)
 VISIT SUMMARY: Today, you had your annual physical exam. Your blood pressure, headaches, and palpitations are being managed with propranolol . You have been prescribed pantoprazole  for heartburn. Your recent blood work shows prediabetes, and you received shingles and pneumonia vaccines.  YOUR PLAN: HYPERTENSION: Your blood pressure is generally in the 120s to 130s mmHg, with occasional higher readings. Headaches and palpitations have improved with propranolol . -Continue taking propranolol  at the current dose. -Monitor your blood pressure at home and report if it is consistently in the 140s or higher. -You have been prescribed a 90-day supply of propranolol .   GASTROESOPHAGEAL REFLUX DISEASE: You have heartburn and a sour stomach, particularly at night, which may be related to propranolol .  -Take pantoprazole  daily as prescribed.  PREDIABETES: Your recent hemoglobin A1c level is 5.7%, indicating prediabetes. -Continue monitoring your A1c levels regularly.  ANNUAL PHYSICAL EXAMINATION AND PREVENTIVE CARE: Your blood work is normal, and your cholesterol has improved since you stopped drinking alcohol. Your mammogram is up to date, and your next colonoscopy is due in December 2027. You are eligible for shingles and pneumonia vaccines. -You received shingles and pneumonia vaccines today. -Ensure your Pap smear is completed and that records are obtained from your OBGYN. -Continue monitoring your A1c levels.  If you have any problems before your next visit feel free to message me via MyChart (minor issues or questions) or call the office, otherwise you may reach out to schedule an office visit.  Thank you! Saddie Sacks, PA-C

## 2025-01-17 NOTE — Progress Notes (Signed)
 "  Complete physical exam  Patient: Cindy [name]   DOB: [birth year]   54 y.o. Female  MRN: [id number]  Subjective:    Chief Complaint  Patient presents with   Annual Exam    Physical    History of Present Illness   Cindy [name] is a 54 year old female who presents for an annual physical exam.  Blood pressure monitoring - Home blood pressure readings generally in the 120s to 130s mmHg SBP. Checks BP regularly.  - Did not bring blood pressure log to appointment  Headache symptoms - Headaches have improved significantly - Currently occur very occasionally and are mild - Previously were debilitating and occurred at least once a month - Currently taking propranolol , which initially caused fatigue but is now tolerated  Gastroesophageal reflux symptoms - Heartburn and sour stomach, particularly at night - Describes sensation as 'burning up in my throat' - Symptoms began after starting propranolol  - Pantoprazole , taken from her husband's supply, is effective for symptom relief  Laboratory findings and supplementation - Recent hemoglobin A1c is 5.7% - Vitamin D  supplementation has improved serum vitamin D  levels  Preventive health maintenance - 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 0  Injury with Fall? 0  Risk for fall due to : No Fall Risks  Follow up Falls evaluation completed     Most recent depression screenings:    01/17/2025    9:27 AM 10/24/2024   10:51 AM  PHQ 2/9 Scores  PHQ - 2 Score 0 0  PHQ- 9 Score 3     Vision:Within last year and Dental: No current dental problems and Receives regular dental care    Patient Care Team: Gayle Saddie JULIANNA DEVONNA as PCP - [facility]  Ob/Gyn Vernetta, Lonni GRADE, MD as Consulting Physician (Orthopedic Surgery) Tyree Nanetta SAILOR, RN as Oncology Nurse Navigator Ebbie Cough, MD as Consulting Physician (General Surgery) Lanny Callander, MD as Consulting Physician (Hematology) Burton, Lacie K, NP as Nurse Practitioner (Nurse Practitioner) Shannon Agent, MD as Consulting Physician (Radiation Oncology)   Show/hide medication list[1]  ROS  Per HPI      Objective:     BP (!) 149/79   Pulse 63   Temp 98.2 F (36.8 C) (Oral)   Ht 5' 2 (1.575 m)   Wt 172 lb (78 kg)   LMP  (LMP Unknown) Comment: last period 1 yr 10-2021  SpO2 100%   BMI 31.46 kg/m    Physical Exam Constitutional:      General: She is not in acute distress.    Appearance: Normal appearance.  Cardiovascular:     Rate and Rhythm: Normal rate and regular rhythm.     Heart sounds: Normal heart sounds. No murmur heard.    No friction rub. No gallop.  Pulmonary:     Effort: Pulmonary effort is normal. No respiratory distress.     Breath sounds: Normal breath sounds.  Abdominal:     General: Bowel sounds are normal.  Musculoskeletal:        General: No swelling.     Cervical back: Neck supple.  Lymphadenopathy:     Cervical: No cervical adenopathy.  Skin:  General: Skin is warm and dry.  Neurological:     General: No focal deficit present.     Mental Status: She is alert.  Psychiatric:        Mood and Affect: Mood normal.        Behavior: Behavior normal.        Thought Content: Thought content normal.      No results found for any visits on 01/17/25. Last CBC Lab Results  Component Value Date   WBC 4.9 01/10/2025   HGB 12.5 01/10/2025   HCT 38.6 01/10/2025   MCV 95 01/10/2025   MCH 30.6 01/10/2025   RDW 12.6 01/10/2025   PLT 232 01/10/2025   Last metabolic panel Lab Results  Component Value Date   GLUCOSE 89 01/10/2025   NA 136 01/10/2025   K 4.2 01/10/2025   CL 98 01/10/2025   CO2 25 01/10/2025   BUN 9  01/10/2025   CREATININE 0.75 01/10/2025   EGFR 95 01/10/2025   CALCIUM 9.1 01/10/2025   PROT 6.7 01/10/2025   ALBUMIN 4.4 01/10/2025   LABGLOB 2.3 01/10/2025   AGRATIO 2.0 07/03/2019   BILITOT 0.5 01/10/2025   ALKPHOS 54 01/10/2025   AST 12 01/10/2025   ALT 7 01/10/2025   ANIONGAP 8 10/24/2024   Last lipids Lab Results  Component Value Date   CHOL 152 01/10/2025   HDL 54 01/10/2025   LDLCALC 74 01/10/2025   TRIG 135 01/10/2025   CHOLHDL 2.8 01/10/2025   Last hemoglobin A1c Lab Results  Component Value Date   HGBA1C 5.7 (H) 01/10/2025   Last thyroid functions Lab Results  Component Value Date   TSH 1.190 01/10/2025   Last vitamin D  Lab Results  Component Value Date   VD25OH 95.2 01/10/2025        Assessment & Plan:    Routine Health Maintenance and Physical Exam  Health Maintenance  Topic Date Due   Pap with HPV screening  12/16/2024   COVID-19 Vaccine (5 - 2025-26 season) 02/02/2025*   Zoster (Shingles) Vaccine (2 of 2) 03/14/2025   Breast Cancer Screening  01/09/2026   Colon Cancer Screening  12/01/2026   DTaP/Tdap/Td vaccine (2 - Td or Tdap) 10/19/2027   Pneumococcal Vaccine for age over 69  Completed   Flu Shot  Completed   HPV Vaccine (No Doses Required) Completed   Hepatitis C Screening  Completed   HIV Screening  Completed   Meningitis B Vaccine  Aged Out   Hepatitis B Vaccine  Discontinued  *Topic was postponed. The date shown is not the original due date.    Discussed health benefits of physical activity, and encouraged her to engage in regular exercise appropriate for her age and condition.  Encounter for vaccination -     Varicella-zoster vaccine IM -     Pneumococcal conjugate vaccine 20-valent  Essential hypertension Assessment & Plan: BP Goal <140/90. BP above goal in office, but well within goal on ambulatory BP monitoring. CMP and CBC WNL. Advised to continue checking BP at home and notify of readings persistently greater than 140.  Patient verbalized understanding and was in agreement with the plan. Continue propranolol  80 mg ER nightly for BP control. Will continue to monitor.    Migraine without aura and without status migrainosus, not intractable Assessment & Plan: Migraine episodes decreased in frequency and severity. - Continue current management with Propranolol  80 mg ER nightly and over-the-counter Tylenol  and ibuprofen as needed.   Gastroesophageal reflux disease without esophagitis  Assessment & Plan: Heartburn and sour stomach ongoing for several months. Responds well to her husband's pantoprazole . Symptoms include burning throat at night. - Prescribed pantoprazole  40 mg for prn daily use    Malignant neoplasm of upper-outer quadrant of left breast in female, estrogen receptor positive (HCC) Assessment & Plan: Follows with Powell Lessen with Oncology. On Tamoxifen  therapy.   Per last oncology A&P:  Stage IA, pT1cN0M0, ER+/PR+/HER2-, Grade II, RS 23 -Screening detected invasive ductal carcinoma diagnosed 01/01/2021  -S/p left lumpectomy with SLNB by Dr Ebbie on 01/28/21, path showed 1.2cm IDC, grade 2 and intermediate grade DCIS. Margins and LN negative. RS of 23, distant recurrence risk of 9%. -S/p adjuvant radiation with Dr Shannon 03/09/21-04/06/21.  -she began tamoxifen  in late April/early May 2022.  -FSH from 10/18/22 showed she is still perimenopausal, but close to post menopause.   -Screening mammogram 1/0/24 and MRI 07/06/23 were both benign  -03/2023: FSH perimenopausal (17), she continues tamoxifen  -04/25/2024  -reviewed 3D screening mammogram done 01/09/2024 with benign results.   -07/05/2024 -benign breast MRI. -10/24/2024 - benign breast exam. Continue tamoxifen  daily. Diagnostic mammogram ordered for 12/2024. Plan for labs and follow up in 6 months, sooner if needed.   Wellness examination Assessment & Plan: Blood work normal. Cholesterol improved post-alcohol cessation. A1c at 5.7, indicating  prediabetes. Thyroid and vitamin D  normal. Mammogram up to date. Colonoscopy due December 2027. Pap smear pending. Eligible for shingles and pneumonia vaccines. - Administered shingles and pneumonia vaccines today. - Ensure Pap smear is completed and records obtained from OBGYN. - Continue monitoring A1c levels.   Other orders -     Propranolol  HCl ER; Take 1 capsule (80 mg total) by mouth at bedtime.  Dispense: 90 capsule; Refill: 3 -     Pantoprazole  Sodium; Take 1 tablet (40 mg total) by mouth daily.  Dispense: 90 tablet; Refill: 3      Return in about 6 months (around 07/17/2025) for HTN, pre-dm .     Saddie JULIANNA Sacks, PA-C     [1]  Outpatient Medications Prior to Visit  Medication Sig   acetaminophen  (TYLENOL ) 500 MG tablet Take 500 mg by mouth as needed.   Ibuprofen 200 MG CAPS Take 400 mg by mouth as needed.   tamoxifen  (NOLVADEX ) 20 MG tablet TAKE 1 TABLET (20 MG TOTAL) BY MOUTH DAILY.   [DISCONTINUED] propranolol  ER (INDERAL  LA) 80 MG 24 hr capsule Take 1 capsule (80 mg total) by mouth at bedtime.   No facility-administered medications prior to visit.   "

## 2025-01-17 NOTE — Assessment & Plan Note (Signed)
 Migraine episodes decreased in frequency and severity. - Continue current management with Propranolol  80 mg ER nightly and over-the-counter Tylenol  and ibuprofen as needed.

## 2025-03-04 ENCOUNTER — Ambulatory Visit: Payer: Self-pay

## 2025-04-18 ENCOUNTER — Ambulatory Visit

## 2025-04-19 ENCOUNTER — Inpatient Hospital Stay: Admitting: Nurse Practitioner

## 2025-04-19 ENCOUNTER — Inpatient Hospital Stay

## 2025-07-12 ENCOUNTER — Other Ambulatory Visit

## 2025-07-17 ENCOUNTER — Ambulatory Visit
